# Patient Record
Sex: Female | Born: 1935 | Race: Black or African American | Hispanic: No | Smoking: Never smoker
Health system: Southern US, Community
[De-identification: ages and names within clinical notes are randomized; demographics above are authoritative.]

## PROBLEM LIST (undated history)

## (undated) DIAGNOSIS — E785 Hyperlipidemia, unspecified: Secondary | ICD-10-CM

## (undated) DIAGNOSIS — K219 Gastro-esophageal reflux disease without esophagitis: Secondary | ICD-10-CM

## (undated) DIAGNOSIS — Z8719 Personal history of other diseases of the digestive system: Secondary | ICD-10-CM

## (undated) DIAGNOSIS — E119 Type 2 diabetes mellitus without complications: Secondary | ICD-10-CM

## (undated) DIAGNOSIS — F411 Generalized anxiety disorder: Secondary | ICD-10-CM

## (undated) DIAGNOSIS — M129 Arthropathy, unspecified: Secondary | ICD-10-CM

## (undated) DIAGNOSIS — I10 Essential (primary) hypertension: Secondary | ICD-10-CM

## (undated) DIAGNOSIS — D649 Anemia, unspecified: Secondary | ICD-10-CM

## (undated) DIAGNOSIS — E538 Deficiency of other specified B group vitamins: Secondary | ICD-10-CM

## (undated) DIAGNOSIS — E042 Nontoxic multinodular goiter: Secondary | ICD-10-CM

## (undated) HISTORY — DX: Hyperlipidemia, unspecified: E78.5

## (undated) HISTORY — DX: Type 2 diabetes mellitus without complications: E11.9

## (undated) HISTORY — DX: Personal history of other diseases of the digestive system: Z87.19

## (undated) HISTORY — DX: Arthropathy, unspecified: M12.9

## (undated) HISTORY — DX: Essential (primary) hypertension: I10

## (undated) HISTORY — DX: Gastro-esophageal reflux disease without esophagitis: K21.9

## (undated) HISTORY — DX: Anemia, unspecified: D64.9

## (undated) HISTORY — DX: Generalized anxiety disorder: F41.1

## (undated) HISTORY — DX: Nontoxic multinodular goiter: E04.2

## (undated) HISTORY — PX: ABDOMINAL HYSTERECTOMY: SHX81

## (undated) HISTORY — DX: Deficiency of other specified B group vitamins: E53.8

---

## 1998-02-21 ENCOUNTER — Ambulatory Visit (HOSPITAL_COMMUNITY): Admission: RE | Admit: 1998-02-21 | Discharge: 1998-02-21 | Payer: Self-pay | Admitting: Internal Medicine

## 1998-02-26 ENCOUNTER — Emergency Department (HOSPITAL_COMMUNITY): Admission: EM | Admit: 1998-02-26 | Discharge: 1998-02-26 | Payer: Self-pay | Admitting: Emergency Medicine

## 1998-03-01 ENCOUNTER — Other Ambulatory Visit: Admission: RE | Admit: 1998-03-01 | Discharge: 1998-03-01 | Payer: Self-pay | Admitting: Internal Medicine

## 1998-03-06 ENCOUNTER — Ambulatory Visit: Admission: RE | Admit: 1998-03-06 | Discharge: 1998-03-06 | Payer: Self-pay | Admitting: Internal Medicine

## 1998-04-22 ENCOUNTER — Ambulatory Visit (HOSPITAL_COMMUNITY): Admission: RE | Admit: 1998-04-22 | Discharge: 1998-04-22 | Payer: Self-pay | Admitting: Internal Medicine

## 1998-04-22 ENCOUNTER — Other Ambulatory Visit: Admission: RE | Admit: 1998-04-22 | Discharge: 1998-04-22 | Payer: Self-pay | Admitting: Internal Medicine

## 1998-06-02 ENCOUNTER — Ambulatory Visit (HOSPITAL_COMMUNITY): Admission: RE | Admit: 1998-06-02 | Discharge: 1998-06-02 | Payer: Self-pay | Admitting: *Deleted

## 1998-07-14 ENCOUNTER — Emergency Department (HOSPITAL_COMMUNITY): Admission: EM | Admit: 1998-07-14 | Discharge: 1998-07-14 | Payer: Self-pay | Admitting: Emergency Medicine

## 1998-08-02 ENCOUNTER — Encounter: Payer: Self-pay | Admitting: Internal Medicine

## 1998-08-02 ENCOUNTER — Ambulatory Visit (HOSPITAL_COMMUNITY): Admission: RE | Admit: 1998-08-02 | Discharge: 1998-08-02 | Payer: Self-pay | Admitting: Internal Medicine

## 1998-08-18 ENCOUNTER — Ambulatory Visit (HOSPITAL_COMMUNITY): Admission: RE | Admit: 1998-08-18 | Discharge: 1998-08-18 | Payer: Self-pay | Admitting: *Deleted

## 1998-09-15 ENCOUNTER — Ambulatory Visit (HOSPITAL_COMMUNITY): Admission: RE | Admit: 1998-09-15 | Discharge: 1998-09-15 | Payer: Self-pay | Admitting: *Deleted

## 1998-09-15 ENCOUNTER — Encounter: Payer: Self-pay | Admitting: *Deleted

## 1999-06-11 ENCOUNTER — Encounter: Payer: Self-pay | Admitting: *Deleted

## 1999-06-11 ENCOUNTER — Ambulatory Visit (HOSPITAL_COMMUNITY): Admission: RE | Admit: 1999-06-11 | Discharge: 1999-06-11 | Payer: Self-pay | Admitting: *Deleted

## 1999-08-23 ENCOUNTER — Ambulatory Visit (HOSPITAL_COMMUNITY): Admission: RE | Admit: 1999-08-23 | Discharge: 1999-08-23 | Payer: Self-pay | Admitting: Cardiovascular Disease

## 1999-08-23 ENCOUNTER — Encounter: Payer: Self-pay | Admitting: Cardiovascular Disease

## 1999-10-06 ENCOUNTER — Encounter: Payer: Self-pay | Admitting: Emergency Medicine

## 1999-10-06 ENCOUNTER — Emergency Department (HOSPITAL_COMMUNITY): Admission: EM | Admit: 1999-10-06 | Discharge: 1999-10-06 | Payer: Self-pay | Admitting: Emergency Medicine

## 1999-11-10 ENCOUNTER — Emergency Department (HOSPITAL_COMMUNITY): Admission: EM | Admit: 1999-11-10 | Discharge: 1999-11-10 | Payer: Self-pay | Admitting: Emergency Medicine

## 1999-11-10 ENCOUNTER — Encounter: Payer: Self-pay | Admitting: Internal Medicine

## 2000-01-04 ENCOUNTER — Encounter: Payer: Self-pay | Admitting: Internal Medicine

## 2000-01-04 ENCOUNTER — Encounter: Admission: RE | Admit: 2000-01-04 | Discharge: 2000-01-04 | Payer: Self-pay | Admitting: Internal Medicine

## 2000-11-24 ENCOUNTER — Encounter: Payer: Self-pay | Admitting: Internal Medicine

## 2000-11-24 ENCOUNTER — Encounter: Admission: RE | Admit: 2000-11-24 | Discharge: 2000-11-24 | Payer: Self-pay | Admitting: Internal Medicine

## 2003-07-25 ENCOUNTER — Other Ambulatory Visit: Admission: RE | Admit: 2003-07-25 | Discharge: 2003-07-25 | Payer: Self-pay | Admitting: Internal Medicine

## 2003-08-28 ENCOUNTER — Encounter: Payer: Self-pay | Admitting: Emergency Medicine

## 2003-08-28 ENCOUNTER — Emergency Department (HOSPITAL_COMMUNITY): Admission: EM | Admit: 2003-08-28 | Discharge: 2003-08-28 | Payer: Self-pay | Admitting: Emergency Medicine

## 2003-11-12 LAB — HM COLONOSCOPY: HM Colonoscopy: NORMAL

## 2003-11-22 ENCOUNTER — Ambulatory Visit (HOSPITAL_COMMUNITY): Admission: RE | Admit: 2003-11-22 | Discharge: 2003-11-22 | Payer: Self-pay | Admitting: Cardiology

## 2003-12-13 DIAGNOSIS — Z8719 Personal history of other diseases of the digestive system: Secondary | ICD-10-CM

## 2003-12-13 HISTORY — DX: Personal history of other diseases of the digestive system: Z87.19

## 2004-01-04 ENCOUNTER — Encounter (INDEPENDENT_AMBULATORY_CARE_PROVIDER_SITE_OTHER): Payer: Self-pay | Admitting: Specialist

## 2004-01-04 ENCOUNTER — Ambulatory Visit (HOSPITAL_COMMUNITY): Admission: RE | Admit: 2004-01-04 | Discharge: 2004-01-04 | Payer: Self-pay | Admitting: Gastroenterology

## 2005-07-08 ENCOUNTER — Encounter: Payer: Self-pay | Admitting: Internal Medicine

## 2005-10-16 ENCOUNTER — Encounter: Admission: RE | Admit: 2005-10-16 | Discharge: 2005-10-16 | Payer: Self-pay | Admitting: Internal Medicine

## 2005-12-17 ENCOUNTER — Encounter: Admission: RE | Admit: 2005-12-17 | Discharge: 2006-03-17 | Payer: Self-pay | Admitting: Internal Medicine

## 2006-02-10 ENCOUNTER — Inpatient Hospital Stay (HOSPITAL_COMMUNITY): Admission: AD | Admit: 2006-02-10 | Discharge: 2006-02-13 | Payer: Self-pay | Admitting: Internal Medicine

## 2006-09-12 ENCOUNTER — Encounter: Payer: Self-pay | Admitting: Internal Medicine

## 2006-10-30 ENCOUNTER — Encounter: Admission: RE | Admit: 2006-10-30 | Discharge: 2006-10-30 | Payer: Self-pay | Admitting: Internal Medicine

## 2006-11-13 ENCOUNTER — Encounter: Admission: RE | Admit: 2006-11-13 | Discharge: 2006-11-13 | Payer: Self-pay | Admitting: Internal Medicine

## 2007-07-22 ENCOUNTER — Encounter: Payer: Self-pay | Admitting: Internal Medicine

## 2007-07-22 LAB — CONVERTED CEMR LAB
Cholesterol: 220 mg/dL
LDL Cholesterol: 122 mg/dL

## 2007-11-17 ENCOUNTER — Encounter: Admission: RE | Admit: 2007-11-17 | Discharge: 2007-11-17 | Payer: Self-pay | Admitting: Internal Medicine

## 2008-04-07 ENCOUNTER — Encounter: Admission: RE | Admit: 2008-04-07 | Discharge: 2008-04-07 | Payer: Self-pay | Admitting: Internal Medicine

## 2008-11-17 ENCOUNTER — Encounter: Admission: RE | Admit: 2008-11-17 | Discharge: 2008-11-17 | Payer: Self-pay | Admitting: Internal Medicine

## 2009-02-27 ENCOUNTER — Encounter: Payer: Self-pay | Admitting: Internal Medicine

## 2009-02-27 LAB — CONVERTED CEMR LAB
Cholesterol: 190 mg/dL
HDL: 64 mg/dL
Triglyceride fasting, serum: 169 mg/dL

## 2009-11-20 ENCOUNTER — Encounter: Admission: RE | Admit: 2009-11-20 | Discharge: 2009-11-20 | Payer: Self-pay | Admitting: Internal Medicine

## 2009-12-18 ENCOUNTER — Encounter: Admission: RE | Admit: 2009-12-18 | Discharge: 2009-12-18 | Payer: Self-pay | Admitting: Internal Medicine

## 2010-01-25 ENCOUNTER — Encounter: Payer: Self-pay | Admitting: Internal Medicine

## 2010-01-25 LAB — CONVERTED CEMR LAB
ALT: 11 units/L
Albumin: 3.9 g/dL
Alkaline Phosphatase: 101 units/L
CO2: 25 meq/L
Cholesterol: 234 mg/dL
Creatinine, Ser: 0.98 mg/dL
HDL: 64 mg/dL
LDL Cholesterol: 139 mg/dL
Potassium: 5 meq/L
Triglyceride fasting, serum: 157 mg/dL

## 2010-03-28 ENCOUNTER — Encounter: Payer: Self-pay | Admitting: Internal Medicine

## 2010-03-29 ENCOUNTER — Encounter: Payer: Self-pay | Admitting: Internal Medicine

## 2010-03-29 LAB — CONVERTED CEMR LAB
CO2: 24 meq/L
Calcium: 9.4 mg/dL
Chloride: 24 meq/L
HCT: 35.4 %
Hemoglobin: 11.4 g/dL
MCV: 87.2 fL
Platelets: 264 10*3/uL
Potassium: 5.3 meq/L
WBC: 6.6 10*3/uL

## 2010-04-03 ENCOUNTER — Inpatient Hospital Stay (HOSPITAL_BASED_OUTPATIENT_CLINIC_OR_DEPARTMENT_OTHER): Admission: RE | Admit: 2010-04-03 | Discharge: 2010-04-03 | Payer: Self-pay | Admitting: Cardiology

## 2010-04-03 ENCOUNTER — Encounter: Payer: Self-pay | Admitting: Internal Medicine

## 2010-08-03 ENCOUNTER — Encounter: Payer: Self-pay | Admitting: Internal Medicine

## 2010-08-03 LAB — CONVERTED CEMR LAB
ALT: 15 units/L
AST: 15 units/L
Albumin: 4.1 g/dL
Alkaline Phosphatase: 112 units/L
CO2: 24 meq/L
Glucose, Bld: 157 mg/dL
HDL: 71 mg/dL
Total Bilirubin: 0.6 mg/dL
Total Protein: 7.1 g/dL

## 2010-10-08 ENCOUNTER — Ambulatory Visit: Payer: Self-pay | Admitting: Internal Medicine

## 2010-10-08 DIAGNOSIS — R519 Headache, unspecified: Secondary | ICD-10-CM | POA: Insufficient documentation

## 2010-10-08 DIAGNOSIS — R51 Headache: Secondary | ICD-10-CM

## 2010-10-08 DIAGNOSIS — K219 Gastro-esophageal reflux disease without esophagitis: Secondary | ICD-10-CM | POA: Insufficient documentation

## 2010-10-08 DIAGNOSIS — E119 Type 2 diabetes mellitus without complications: Secondary | ICD-10-CM

## 2010-10-08 DIAGNOSIS — Z8719 Personal history of other diseases of the digestive system: Secondary | ICD-10-CM

## 2010-10-08 DIAGNOSIS — D649 Anemia, unspecified: Secondary | ICD-10-CM

## 2010-10-08 DIAGNOSIS — I10 Essential (primary) hypertension: Secondary | ICD-10-CM | POA: Insufficient documentation

## 2010-10-08 DIAGNOSIS — R5383 Other fatigue: Secondary | ICD-10-CM | POA: Insufficient documentation

## 2010-10-08 DIAGNOSIS — E785 Hyperlipidemia, unspecified: Secondary | ICD-10-CM

## 2010-10-08 DIAGNOSIS — R5381 Other malaise: Secondary | ICD-10-CM

## 2010-10-08 DIAGNOSIS — F411 Generalized anxiety disorder: Secondary | ICD-10-CM | POA: Insufficient documentation

## 2010-10-08 DIAGNOSIS — R221 Localized swelling, mass and lump, neck: Secondary | ICD-10-CM | POA: Insufficient documentation

## 2010-10-08 DIAGNOSIS — R220 Localized swelling, mass and lump, head: Secondary | ICD-10-CM

## 2010-10-08 DIAGNOSIS — M129 Arthropathy, unspecified: Secondary | ICD-10-CM | POA: Insufficient documentation

## 2010-10-09 DIAGNOSIS — E538 Deficiency of other specified B group vitamins: Secondary | ICD-10-CM

## 2010-10-09 LAB — CONVERTED CEMR LAB
Alkaline Phosphatase: 104 units/L (ref 39–117)
Basophils Relative: 0.3 % (ref 0.0–3.0)
Bilirubin, Direct: 0.1 mg/dL (ref 0.0–0.3)
Cholesterol: 171 mg/dL (ref 0–200)
Creatinine, Ser: 0.8 mg/dL (ref 0.4–1.2)
Folate: 20 ng/mL
HCT: 29.7 % — ABNORMAL LOW (ref 36.0–46.0)
HDL: 57.9 mg/dL (ref >39.00)
Hemoglobin: 10.1 g/dL — ABNORMAL LOW (ref 12.0–15.0)
Hgb A1c MFr Bld: 6.7 % — ABNORMAL HIGH (ref 4.6–6.5)
LDL Cholesterol: 91 mg/dL (ref 0–99)
MCV: 87.9 fL (ref 78.0–100.0)
Monocytes Absolute: 0.6 10*3/uL (ref 0.1–1.0)
Neutro Abs: 6.3 10*3/uL (ref 1.4–7.7)
Neutrophils Relative %: 70.9 % (ref 43.0–77.0)
Platelets: 397 10*3/uL (ref 150.0–400.0)
RDW: 13.1 % (ref 11.5–14.6)
TSH: 1.49 microintl units/mL (ref 0.35–5.50)
Total CHOL/HDL Ratio: 3
Total Protein: 6.9 g/dL (ref 6.0–8.3)
Triglycerides: 110 mg/dL (ref 0.0–149.0)
Vitamin B-12: 174 pg/mL — ABNORMAL LOW (ref 211–911)
WBC: 8.9 10*3/uL (ref 4.5–10.5)

## 2010-10-10 ENCOUNTER — Encounter: Admission: RE | Admit: 2010-10-10 | Discharge: 2010-10-10 | Payer: Self-pay | Admitting: Internal Medicine

## 2010-10-10 DIAGNOSIS — E042 Nontoxic multinodular goiter: Secondary | ICD-10-CM

## 2010-10-16 ENCOUNTER — Ambulatory Visit: Payer: Self-pay | Admitting: Internal Medicine

## 2010-11-21 ENCOUNTER — Ambulatory Visit
Admission: RE | Admit: 2010-11-21 | Discharge: 2010-11-21 | Payer: Self-pay | Source: Home / Self Care | Attending: Internal Medicine | Admitting: Internal Medicine

## 2010-11-21 ENCOUNTER — Encounter
Admission: RE | Admit: 2010-11-21 | Discharge: 2010-11-21 | Payer: Self-pay | Source: Home / Self Care | Attending: Internal Medicine | Admitting: Internal Medicine

## 2010-11-22 ENCOUNTER — Encounter: Payer: Self-pay | Admitting: Internal Medicine

## 2010-11-22 ENCOUNTER — Ambulatory Visit
Admission: RE | Admit: 2010-11-22 | Discharge: 2010-11-22 | Payer: Self-pay | Source: Home / Self Care | Attending: Internal Medicine | Admitting: Internal Medicine

## 2010-11-22 DIAGNOSIS — J019 Acute sinusitis, unspecified: Secondary | ICD-10-CM | POA: Insufficient documentation

## 2010-12-11 NOTE — Assessment & Plan Note (Signed)
Summary: PER SARAH B12 INJ  VL   STC  Nurse Visit   Allergies: No Known Drug Allergies  Medication Administration  Injection # 1:    Medication: Vit B12 1000 mcg    Diagnosis: VITAMIN B12 DEFICIENCY (ICD-266.2)    Route: IM    Site: L deltoid    Exp Date: 06/11/2012    Lot #: [id number]    Mfr: American Regent    Patient tolerated injection without complications    Given by: Lamar Sprinkles, CMA (October 16, 2010 3:18 PM)  Orders Added: 1)  Vit B12 1000 mcg [J3420] 2)  Admin of Therapeutic Inj  intramuscular or subcutaneous [96372]   Medication Administration  Injection # 1:    Medication: Vit B12 1000 mcg    Diagnosis: VITAMIN B12 DEFICIENCY (ICD-266.2)    Route: IM    Site: L deltoid    Exp Date: 06/11/2012    Lot #: [id number]    Mfr: American Regent    Patient tolerated injection without complications    Given by: Lamar Sprinkles, CMA (October 16, 2010 3:18 PM)  Orders Added: 1)  Vit B12 1000 mcg [J3420] 2)  Admin of Therapeutic Inj  intramuscular or subcutaneous [81191]

## 2010-12-11 NOTE — Assessment & Plan Note (Signed)
Summary: New / Medicare / # cd   Vital Signs:  Patient profile:   77 year old female Height:      65.5 inches (166.37 cm) Weight:      193.8 pounds (88.09 kg) BMI:     31.87 O2 Sat:      97 % on Room air Temp:     98.9 degrees F (37.17 degrees C) oral Pulse rate:   83 / minute BP sitting:   152 / 76  (left arm) Cuff size:   large  Vitals Entered By: Orlan Leavens RMA (October 08, 2010 9:37 AM)  O2 Flow:  Room air CC: New patient Is Patient Diabetic? Yes Did you bring your meter with you today? No Pain Assessment Patient in pain? no        Primary Care Provider:  Newt Lukes MD  CC:  New patient.  History of Present Illness: new pt to me and our practice, here to est care declines providing info on prior PCP at this time --  1) DM2 - reports compliance with ongoing medical treatment and no changes in medication dose or frequency. denies adverse side effects related to current therapy. checks sugars 2x/d, no symptoms or signs hypoglycemia  2) HTN - reports compliance with ongoing medical treatment and no changes in medication dose or frequency. denies adverse side effects related to current therapy. no CP, HA or change in  edema  3) dyslipidema - reports compliance with ongoing medical treatment and no changes in medication dose or frequency. denies adverse side effects related to current therapy. no myalgias or abd pain/GI symptoms   4) GERD - reports compliance with ongoing medical treatment and no changes in medication dose or frequency. denies adverse side effects related to current therapy.   Preventive Screening-Counseling & Management  Alcohol-Tobacco     Alcohol drinks/day: 0     Alcohol Counseling: not indicated; patient does not drink     Smoking Status: never     Tobacco Counseling: not indicated; no tobacco use  Caffeine-Diet-Exercise     Does Patient Exercise: yes     Times/week: 3     Exercise Counseling: to improve exercise regimen  Depression Counseling: not indicated; screening negative for depression  Safety-Violence-Falls     Seat Belt Counseling: not indicated; patient wears seat belts     Helmet Counseling: not applicable     Firearm Counseling: not indicated; uses recommended firearm safety measures     Violence Counseling: not applicable     Fall Risk Counseling: not indicated; no significant falls noted  Clinical Review Panels:  Prevention   Last Mammogram:  No specific mammographic evidence of malignancy.  Assessment: BIRADS 1. [facility].    (11/20/2009)   Last Colonoscopy:  Results: Normal. (11/12/2003)   Current Medications (verified): 1)  High Potency Iron 86 (27 Fe) Mg Caps (Ferrous Fumarate) .... Take 1 By Mouth Once Daily 2)  Lisinopril 20 Mg Tabs (Lisinopril) .... Take 1 By Mouth Once Daily 3)  Simvastatin 40 Mg Tabs (Simvastatin) .... Take 1 At Bedtime 4)  Metformin Hcl 1000 Mg Tabs (Metformin Hcl) .... Take 1 Two Times A Day 5)  Aspirin 81 Mg Tabs (Aspirin) .... Take 1 By Mouth Once Daily 6)  Prilosec Otc 20 Mg Tbec (Omeprazole Magnesium) .... 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 I have reviewed all other systems and they were negative.    Physical Exam  General:  alert, well-developed, well-nourished, and cooperative to examination.    Head:  Normocephalic and atraumatic without obvious abnormalities. No apparent alopecia or balding. Eyes:  vision grossly intact; pupils equal, round and reactive to light.  conjunctiva and lids normal.    Ears:  normal pinnae bilaterally, without erythema, swelling, or tenderness to palpation. TMs clear, without effusion, or cerumen impaction. Hearing grossly normal bilaterally  Mouth:  upper plate and teeth and gums in fair repair; mucous membranes moist, without lesions or ulcers. oropharynx clear without exudate, no erythema.  Neck:  supple, full ROM, soft 2cm  right lower anterior neck with well healed scar below this mass (prior surg), no thyromegaly; no thyroid nodules or tenderness. no JVD or carotid bruits.   Lungs:  normal respiratory effort, no intercostal retractions or use of accessory muscles; normal breath sounds bilaterally - no crackles and no wheezes.    Heart:  normal rate, regular rhythm, 3/6 holosyst murmur, and no rub. BLE without edema. normal DP pulses and normal cap refill in all 4 extremities    Abdomen:  soft, non-tender, normal bowel sounds, no distention; no masses and no appreciable hepatomegaly or splenomegaly.   Genitalia:  defer Msk:  No deformity or scoliosis noted of thoracic or lumbar spine.   Neurologic:  alert & oriented X3 and cranial nerves II-XII symetrically intact.  strength normal in all extremities, sensation intact to light touch, and gait normal. speech fluent without dysarthria or aphasia; follows commands with good comprehension.  Skin:  no rashes, vesicles, ulcers, or erythema. No nodules or irregularity to palpation.  Psych:  Oriented X3, memory intact for recent and remote, normally interactive, good eye contact, not anxious appearing, not depressed appearing, and not agitated.      Impression & Recommendations:  Problem # 1:  DIABETES  MELLITUS, TYPE II (ICD-250.00) misunderstanding per pt re: meds - has only taken once daily -  will check a1c and Cr but encourage compliance with two times a day dose metformin prior to other med changes pt declines [place] sedning for PCP records - but ok ROI from cards Her updated medication list for this problem includes:    Lisinopril 20 Mg Tabs (Lisinopril) .Marland Kitchen... Take 1 by mouth once daily    Metformin Hcl 1000 Mg Tabs (Metformin hcl) .Marland Kitchen... Take 1 two times a day    Aspirin 81 Mg Tabs (Aspirin) .Marland Kitchen... Take 1 by mouth once daily  Orders: TLB-A1C / Hgb A1C (Glycohemoglobin) (83036-A1C) TLB-Creatinine, Blood (82565-CREA)  Problem # 2:  HYPERLIPIDEMIA (ICD-272.4)  Her updated medication list for this problem includes:    Simvastatin 40 Mg Tabs (Simvastatin) .Marland Kitchen... Take 1 at bedtime  Orders: TLB-Lipid Panel (80061-LIPID)  Problem # 3:  HYPERTENSION (ICD-401.9)  uncontrolled - add Bbloc to ACEI recehck next visit Her updated medication list for this problem includes:    Lisinopril 20  Mg Tabs (Lisinopril) .Marland Kitchen... Take 1 by mouth once daily    Toprol Xl 50 Mg Xr24h-tab (Metoprolol succinate) .Marland Kitchen... 1 by mouth once daily  Orders: TLB-Creatinine, Blood (82565-CREA) Prescription Created Electronically [phone])  BP today: 152/76  Problem # 4:  NECK MASS (ICD-784.2) hx prior surg removal in 1970s "but it grew back" denies swallow trouble or pain but slow inc size - refer for [place] for eval of same -  Orders: Radiology Referral (Radiology)  Problem # 5:  FATIGUE (ICD-780.79) nonspecific symptoms and no other abn on exam, hx anemia - check labs tx osteoarthritis pain to see if this helps energy - see next Orders: TLB-Creatinine, Blood (82565-CREA) TLB-Hepatic/Liver Function Pnl (80076-HEPATIC) TLB-CBC Platelet - w/Differential (85025-CBCD) TLB-B12 + Folate Pnl (82746_82607-B12/FOL) TLB-Iron, (Fe) Total (83540-FE) TLB-TSH (Thyroid Stimulating Hormone) (84443-TSH)  Problem # 6:   ARTHRITIS, GENERALIZED (ICD-716.99)  pain in knees, right hip (groin) improved with aleve but concerned with hx "ulcer" - 2007 hosp with egd reviewed - gastritis, no ulcer change NSAID to meloxicam for pain symptoms - also rec tylenol consider xray or ortho eval as needed   Orders: Prescription Created Electronically [phone])  Problem # 7:  ANEMIA-NOS (ICD-285.9) remote hx same - on iron for same check labs now - may need to send for records Her updated medication list for this problem includes:    High Potency Iron 86 (27 Fe) Mg Caps (Ferrous fumarate) .Marland Kitchen... Take 1 by mouth once daily  Orders: TLB-CBC Platelet - w/Differential (85025-CBCD) TLB-B12 + Folate Pnl (09811_91478-G95/AOZ) TLB-Iron, (Fe) Total (83540-FE)  Complete Medication List: 1)  High Potency Iron 86 (27 Fe) Mg Caps (Ferrous fumarate) .... Take 1 by mouth once daily 2)  Lisinopril 20 Mg Tabs (Lisinopril) .... Take 1 by mouth once daily 3)  Simvastatin 40 Mg Tabs (Simvastatin) .... Take 1 at bedtime 4)  Metformin Hcl 1000 Mg Tabs (Metformin hcl) .... Take 1 two times a day 5)  Aspirin 81 Mg Tabs (Aspirin) .... Take 1 by mouth once daily 6)  Prilosec Otc 20 Mg Tbec (Omeprazole magnesium) .... Take 1 by mouth once daily 7)  Toprol Xl 50 Mg Xr24h-tab (Metoprolol succinate) .Marland Kitchen.. 1 by mouth once daily 8)  Meloxicam 15 Mg Tabs (Meloxicam) .Marland Kitchen.. 1 by mouth once daily  Patient Instructions: 1)  it was good to see you today. 2)  will send for records from dr. Sharyn Lull to review 3)  test(s) ordered today - your results will be posted on the phone tree for review in 48-72 hours from the time of test completion; call [phone] and enter your 9 digit MRN (listed above on this page, just below your name); if any changes need to be made or there are abnormal results, you will be contacted directly.  4)  we'll make referral for neck ultrasound. Our office will contact you regarding this appointment once made.  5)  start Toprol  (generic ok) for high blood pressure in addition to your other medications 6)  start meloxicam daily for arthritis pain symptoms  7)  your prescriptions and refills have been electronically submitted to your pharmacy. Please take as directed. Contact our office if you believe you're having problems with the medication(s).  8)  Please schedule a follow-up appointment in 3 months to review diabetes, bloos pressure and other issues, call sooner if problems.  Prescriptions: MELOXICAM 15 MG TABS (MELOXICAM) 1 by mouth once daily  #30 x 3   Entered and Authorized by:   Newt Lukes MD  Signed by:   Newt Lukes MD on 10/08/2010   Method used:   Electronically to        [facility] [phone]* (retail)       [address]       [place], [place]  [address]       Ph: [phone]       Fax: [phone]   RxID:   [id number] TOPROL XL 50 MG XR24H-TAB (METOPROLOL SUCCINATE) 1 by mouth once daily  #30 x 3   Entered and Authorized by:   Newt Lukes MD   Signed by:   Newt Lukes MD on 10/08/2010   Method used:   Electronically to        [facility]. [phone]* (retail)       [address]       [place], [place]  [address]       Ph: [phone]       Fax: [phone]   RxID:   [id number]    Orders Added: 1)  TLB-A1C / Hgb A1C (Glycohemoglobin) [83036-A1C] 2)  TLB-Creatinine, Blood [82565-CREA] 3)  TLB-Lipid Panel [80061-LIPID] 4)  TLB-Hepatic/Liver Function Pnl [80076-HEPATIC] 5)  TLB-CBC Platelet - w/Differential [85025-CBCD] 6)  TLB-B12 + Folate Pnl [82746_82607-B12/FOL] 7)  TLB-Iron, (Fe) Total [83540-FE] 8)  TLB-TSH (Thyroid Stimulating Hormone) [29937-JIR] 9)  Radiology Referral [Radiology] 10)  New Patient Level IV [99204] 11)  Prescription Created Electronically [[id number]]     Colonoscopy  Procedure date:  11/12/2003  Findings:      Results: Normal.   EGD  Procedure date:   02/10/2006  Findings:      Findings: Normal , but was positive for gastric polyp in the past, done by Dr. Bosie Clos   Mammogram  Procedure date:  11/20/2009  Findings:      No specific mammographic evidence of malignancy.  Assessment: BIRADS 1. Location: [facility].

## 2010-12-13 NOTE — Cardiovascular Report (Signed)
Summary: [place]  [place]   Imported By: Sherian Rein 12/03/2010 07:40:03  _____________________________________________________________________  External Attachment:    Type:   Image     Comment:   External Document

## 2010-12-13 NOTE — Assessment & Plan Note (Signed)
Summary: HEAD COLD/DRAINAGE/#?CD   Vital Signs:  Patient profile:   77 year old female Height:      65.5 inches (166.37 cm) Weight:      193 pounds (87.73 kg) O2 Sat:      96 % on Room air Temp:     98.3 degrees F (36.83 degrees C) oral Pulse rate:   63 / minute BP sitting:   128 / 70  (left arm) Cuff size:   large  Vitals Entered By: Orlan Leavens RMA (November 22, 2010 10:54 AM)  O2 Flow:  Room air CC: Head cold/ cough Is Patient Diabetic? Yes Did you bring your meter with you today? No Pain Assessment Patient in pain? no        Primary Care Provider:  Newt Lukes MD  CC:  Head cold/ cough.  History of Present Illness: c/o head cold onset 1 month ago started as head congestion, progressied to PND and cough white thick sputum no pleurisy, CP, SOB or fever no HA or sinus pressure but +PND and scratch throat not imrpved with otc meds  reviewed chronic med issues  DM2 - reports compliance with ongoing medical treatment and no changes in medication dose or frequency. denies adverse side effects related to current therapy. checks sugars 2x/d, no symptoms or signs hypoglycemia  HTN - reports compliance with ongoing medical treatment and no changes in medication dose or frequency. denies adverse side effects related to current therapy. no CP, HA or change in  edema  dyslipidema - reports compliance with ongoing medical treatment and no changes in medication dose or frequency. denies adverse side effects related to current therapy. no myalgias or abd pain/GI symptoms   GERD - reports compliance with ongoing medical treatment and no changes in medication dose or frequency. denies adverse side effects related to current therapy.   anemia - long hx iron defic - on iron pills and dx b12 defic 09/2010 - now onging b12 shots - no abd pain, melena or brbpr  Clinical Review Panels:  CBC   WBC:  8.9 (10/08/2010)   RBC:  3.38 (10/08/2010)   Hgb:  10.1 (10/08/2010)   Hct:   29.7 (10/08/2010)   Platelets:  397.0 (10/08/2010)   MCV  87.9 (10/08/2010)   MCHC  34.0 (10/08/2010)   RDW  13.1 (10/08/2010)   PMN:  70.9 (10/08/2010)   Lymphs:  17.2 (10/08/2010)   Monos:  7.3 (10/08/2010)   Eosinophils:  4.3 (10/08/2010)   Basophil:  0.3 (10/08/2010)  Complete Metabolic Panel   Creatinine:  0.8 (10/08/2010)   Albumin:  3.2 (10/08/2010)   Total Protein:  6.9 (10/08/2010)   Total Bili:  0.4 (10/08/2010)   Alk Phos:  104 (10/08/2010)   SGPT (ALT):  11 (10/08/2010)   SGOT (AST):  14 (10/08/2010)   Current Medications (verified): 1)  High Potency Iron 86 (27 Fe) Mg Caps (Ferrous Fumarate) .... Take 1 By Mouth Once Daily 2)  Lisinopril 20 Mg Tabs (Lisinopril) .... Take 1 By Mouth Once Daily 3)  Simvastatin 40 Mg Tabs (Simvastatin) .... Take 1 At Bedtime 4)  Metformin Hcl 1000 Mg Tabs (Metformin Hcl) .... Take 1 Two Times A Day 5)  Aspirin 81 Mg Tabs (Aspirin) .... Take 1 By Mouth Once Daily 6)  Prilosec Otc 20 Mg Tbec (Omeprazole Magnesium) .... Take 1 By Mouth Once Daily 7)  Toprol Xl 50 Mg Xr24h-Tab (Metoprolol Succinate) .Marland Kitchen.. 1 By Mouth Once Daily 8)  Meloxicam 15 Mg Tabs (Meloxicam) .Marland KitchenMarland KitchenMarland Kitchen  1 By Mouth Once Daily 9)  Cyanocobalamin 1000 Mcg/ml Soln (Cyanocobalamin) .Marland Kitchen.. Im Monthly  Allergies (verified): No Known Drug Allergies  Past History:  Past Medical History: Anemia-NOS Anxiety - iron and B12 defic Diabetes mellitus, type II Hyperlipidemia Hypertension GERD  MD roster: card- [facility] - schooler optho - hecker podiatry - traid foot  Social History: Reviewed history from 10/08/2010 and no changes required. 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    Physical Exam  General:  alert, well-developed,  well-nourished, and cooperative to examination.   mildly ill Head:  Normocephalic and atraumatic without obvious abnormalities. No apparent alopecia or balding. Eyes:  vision grossly intact; pupils equal, round and reactive to light.  conjunctiva and lids normal.    Ears:  normal pinnae bilaterally, without erythema, swelling, or tenderness to palpation. TMs clear, without effusion, or cerumen impaction. Hearing grossly normal bilaterally  Mouth:  upper plate and teeth and gums in fair repair; mucous membranes moist, without lesions or ulcers. oropharynx clear without exudate, mod erythema. +PND, thick Lungs:  normal respiratory effort, no intercostal retractions or use of accessory muscles; normal breath sounds bilaterally - no crackles and no wheezes.    Heart:  normal rate, regular rhythm, 3/6 holosyst murmur, and no rub. BLE without edema.   Impression & Recommendations:  Problem # 1:  ACUTE SINUSITIS, UNSPECIFIED (ICD-461.9)  Her updated medication list for this problem includes:    Azithromycin 250 Mg Tabs (Azithromycin) .Marland Kitchen... 2 tabs by mouth today, then 1 by mouth daily starting tomorrow    Fluticasone Propionate 50 Mcg/act Susp (Fluticasone propionate) .Marland Kitchen... 2 sprays each nostril every morning  Instructed on treatment. Call if symptoms persist or worsen.   Orders: Prescription Created Electronically [phone])  Problem # 2:  ANEMIA-NOS (ICD-285.9)  Her updated medication list for this problem includes:    High Potency Iron 86 (27 Fe) Mg Caps (Ferrous fumarate) .Marland Kitchen... Take 1 by mouth once daily    Cyanocobalamin 1000 Mcg/ml Soln (Cyanocobalamin) .Marland KitchenMarland KitchenMarland KitchenMarland Kitchen im monthly  chronic hx same - on iron for same - prior GI eval unremarkable cont iron + b12 supplemt  Hgb: 10.1 (10/08/2010)   Hct: 29.7 (10/08/2010)   Platelets: 397.0 (10/08/2010) RBC: 3.38 (10/08/2010)   RDW: 13.1 (10/08/2010)   WBC: 8.9 (10/08/2010) MCV: 87.9 (10/08/2010)   MCHC: 34.0 (10/08/2010) Iron: 47 (10/08/2010)    B12: 174 (10/08/2010)   Folate: 20.0 (10/08/2010)   TSH: 1.49 (10/08/2010)  Complete Medication List: 1)  High Potency Iron 86 (27 Fe) Mg Caps (Ferrous fumarate) .... Take 1 by mouth once daily 2)  Lisinopril 20 Mg Tabs (Lisinopril) .... Take 1 by mouth once daily 3)  Simvastatin 40 Mg Tabs (Simvastatin) .... Take 1 at bedtime 4)  Metformin Hcl 1000 Mg Tabs (Metformin hcl) .... Take 1 two times a day 5)  Aspirin 81 Mg Tabs (Aspirin) .... Take 1 by mouth once daily 6)  Prilosec Otc 20 Mg Tbec (Omeprazole magnesium) .... Take 1 by mouth once daily 7)  Toprol Xl 50 Mg Xr24h-tab (Metoprolol succinate) .Marland Kitchen.. 1 by mouth once daily 8)  Meloxicam 15 Mg Tabs (Meloxicam) .Marland Kitchen.. 1 by mouth once daily 9)  Cyanocobalamin 1000 Mcg/ml Soln (Cyanocobalamin) .Marland Kitchen.. im monthly 10)  Azithromycin 250 Mg Tabs (Azithromycin) .... 2  tabs by mouth today, then 1 by mouth daily starting tomorrow 11)  Fluticasone Propionate 50 Mcg/act Susp (Fluticasone propionate) .... 2 sprays each nostril every morning  Patient Instructions: 1)  it was good to see you today. 2)  zpak and nose spray for sinus symptoms - your prescriptions have been electronically submitted to your pharmacy. Please take as directed. Contact our office if you believe you're having problems with the medication(s).  3)  Get plenty of rest, drink lots of clear liquids, and use Tylenol or Ibuprofen for fever and comfort, Robitussin for cough. Return in 7-10 days if you're not better:sooner if you're feeling worse 4)  Ok to reschedule a follow-up appointment from [facility] to April or May to review diabetes, blood pressure and other issues, call sooner if problems.  Prescriptions: FLUTICASONE PROPIONATE 50 MCG/ACT SUSP (FLUTICASONE PROPIONATE) 2 sprays each nostril every morning  #1 x 3   Entered and Authorized by:   Newt Lukes MD   Signed by:   Newt Lukes MD on 11/22/2010   Method used:   Electronically to        [facility]  [phone]* (retail)       [address]       [place], [place]  [address]       Ph: [phone]       Fax: [phone]   RxID:   [id number] AZITHROMYCIN 250 MG TABS (AZITHROMYCIN) 2 tabs by mouth today, then 1 by mouth daily starting tomorrow  #6 x 0   Entered and Authorized by:   Newt Lukes MD   Signed by:   Newt Lukes MD on 11/22/2010   Method used:   Electronically to        [facility] [phone]* (retail)       [address]       [place], [place]  [address]       Ph: [phone]       Fax: [phone]   RxID:   [id number]    Orders Added: 1)  Est. Patient Level IV [25427] 2)  Prescription Created Electronically [phone]

## 2010-12-13 NOTE — Assessment & Plan Note (Signed)
Summary: [name]  Nurse Visit   Allergies: No Known Drug Allergies  Medication Administration  Injection # 1:    Medication: Vit B12 1000 mcg    Diagnosis: VITAMIN B12 DEFICIENCY (ICD-266.2)    Route: IM    Site: R deltoid    Exp Date: 06/11/2012    Lot #: [id number]    Mfr: American Regent    Patient tolerated injection without complications    Given by: Margaret Pyle, CMA (November 21, 2010 3:02 PM)  Orders Added: 1)  Admin of Therapeutic Inj  intramuscular or subcutaneous [96372] 2)  Vit B12 1000 mcg [J3420]

## 2010-12-24 ENCOUNTER — Ambulatory Visit (INDEPENDENT_AMBULATORY_CARE_PROVIDER_SITE_OTHER): Payer: Medicare Other

## 2010-12-24 ENCOUNTER — Encounter: Payer: Self-pay | Admitting: Internal Medicine

## 2010-12-24 DIAGNOSIS — E538 Deficiency of other specified B group vitamins: Secondary | ICD-10-CM

## 2011-01-02 NOTE — Assessment & Plan Note (Signed)
Summary: [name] [name]  Nurse Visit   Allergies: No Known Drug Allergies  Medication Administration  Injection # 1:    Medication: Vit B12 1000 mcg    Diagnosis: VITAMIN B12 DEFICIENCY (ICD-266.2)    Route: IM    Site: L deltoid    Exp Date: 09/2012    Lot #: [id number]    Mfr: American Regent    Patient tolerated injection without complications    Given by: Brenton Grills CMA Duncan Dull) (December 24, 2010 3:56 PM)  Orders Added: 1)  Admin of Therapeutic Inj  intramuscular or subcutaneous [96372] 2)  Vit B12 1000 mcg [J3420]

## 2011-01-23 ENCOUNTER — Ambulatory Visit
Admission: RE | Admit: 2011-01-23 | Discharge: 2011-01-23 | Disposition: A | Discharge status: Home / Self Care | Payer: Medicare Other | Source: Ambulatory Visit | Attending: Internal Medicine | Admitting: Internal Medicine

## 2011-01-23 ENCOUNTER — Encounter: Payer: Self-pay | Admitting: Internal Medicine

## 2011-01-23 ENCOUNTER — Other Ambulatory Visit: Payer: Self-pay | Admitting: Internal Medicine

## 2011-01-23 ENCOUNTER — Ambulatory Visit (INDEPENDENT_AMBULATORY_CARE_PROVIDER_SITE_OTHER): Payer: Medicare Other

## 2011-01-23 DIAGNOSIS — R0989 Other specified symptoms and signs involving the circulatory and respiratory systems: Secondary | ICD-10-CM

## 2011-01-23 DIAGNOSIS — E538 Deficiency of other specified B group vitamins: Secondary | ICD-10-CM

## 2011-01-29 NOTE — Assessment & Plan Note (Signed)
Summary: PER PT 1 MTH B12--VAL---STC  Nurse Visit   Allergies: No Known Drug Allergies  Medication Administration  Injection # 1:    Medication: Vit B12 1000 mcg    Diagnosis: VITAMIN B12 DEFICIENCY (ICD-266.2)    Route: IM    Site: R deltoid    Exp Date: 09/2012    Lot #: [id number]    Mfr: American Regent    Patient tolerated injection without complications    Given by: Brenton Grills CMA Duncan Dull) (January 23, 2011 3:31 PM)  Orders Added: 1)  Vit B12 1000 mcg [J3420] 2)  Admin of Therapeutic Inj  intramuscular or subcutaneous [16109]

## 2011-01-30 ENCOUNTER — Encounter: Payer: Self-pay | Admitting: *Deleted

## 2011-02-20 ENCOUNTER — Ambulatory Visit (INDEPENDENT_AMBULATORY_CARE_PROVIDER_SITE_OTHER): Payer: Medicare Other

## 2011-02-20 DIAGNOSIS — E538 Deficiency of other specified B group vitamins: Secondary | ICD-10-CM

## 2011-02-20 MED ORDER — CYANOCOBALAMIN 1000 MCG/ML IJ SOLN
1000.0000 ug | Freq: Once | INTRAMUSCULAR | Status: AC
  Administered 2011-02-20: 1000 ug via INTRAMUSCULAR

## 2011-02-26 ENCOUNTER — Ambulatory Visit: Payer: Self-pay | Admitting: Internal Medicine

## 2011-02-27 ENCOUNTER — Encounter: Payer: Self-pay | Admitting: Internal Medicine

## 2011-03-05 ENCOUNTER — Encounter: Payer: Self-pay | Admitting: Internal Medicine

## 2011-03-06 ENCOUNTER — Ambulatory Visit (INDEPENDENT_AMBULATORY_CARE_PROVIDER_SITE_OTHER): Payer: Medicare Other | Admitting: Internal Medicine

## 2011-03-06 ENCOUNTER — Encounter: Payer: Self-pay | Admitting: Internal Medicine

## 2011-03-06 ENCOUNTER — Other Ambulatory Visit (INDEPENDENT_AMBULATORY_CARE_PROVIDER_SITE_OTHER): Payer: Medicare Other

## 2011-03-06 DIAGNOSIS — I10 Essential (primary) hypertension: Secondary | ICD-10-CM

## 2011-03-06 DIAGNOSIS — E119 Type 2 diabetes mellitus without complications: Secondary | ICD-10-CM

## 2011-03-06 DIAGNOSIS — E785 Hyperlipidemia, unspecified: Secondary | ICD-10-CM

## 2011-03-06 LAB — HEMOGLOBIN A1C: Hgb A1c MFr Bld: 6.5 % (ref 4.6–6.5)

## 2011-03-06 NOTE — Assessment & Plan Note (Signed)
The current medical regimen is effective;  continue present plan and medications.  BP Readings from Last 3 Encounters:  03/06/11 140/72  11/22/10 128/70  10/08/10 152/76

## 2011-03-06 NOTE — Patient Instructions (Signed)
It was good to see you today. Test(s) ordered today. Your results will be called to you after review (48-72hours after test completion). If any changes need to be made, you will be notified at that time. Medications reviewed, no changes at this time. Please schedule followup in 3-4 months, call sooner if problems.  

## 2011-03-06 NOTE — Progress Notes (Signed)
  Subjective:    Patient ID: Beverly Vargas, female    DOB: 1926-12-29, 77 y.o.   MRN: [id number]  HPI  Here for follow up - reviewed chronic med issues  DM2 - reports compliance with ongoing medical treatment and no changes in medication dose or frequency. denies adverse side effects related to current therapy. checks sugars 2x/d, no symptoms or signs hypoglycemia  HTN - reports compliance with ongoing medical treatment and no changes in medication dose or frequency. denies adverse side effects related to current therapy. no CP, HA or change in  edema  dyslipidema - reports compliance with ongoing medical treatment and no changes in medication dose or frequency. denies adverse side effects related to current therapy. no myalgias or abd pain/GI symptoms   GERD - reports compliance with ongoing medical treatment and no changes in medication dose or frequency. denies adverse side effects related to current therapy.   anemia - long hx iron defic - on iron pills and dx b12 defic 09/2010 - now onging b12 shots - no abd pain, melena or brbpr  Past Medical History  Diagnosis Date  . VITAMIN B12 DEFICIENCY   . ARTHRITIS, GENERALIZED   . SCHATZKI'S RING, HX OF   . ANEMIA-NOS   . ANXIETY   . HYPERTENSION   . HYPERLIPIDEMIA   . GERD   . DIABETES MELLITUS, TYPE II   . Nontoxic multinodular goiter     Review of Systems  Constitutional: Negative for fever.  Respiratory: Negative for cough.   Cardiovascular: Negative for chest pain.  Genitourinary: Negative for dysuria.  Neurological: Negative for headaches.       Objective:   Physical Exam  Constitutional: She is oriented to person, place, and time. She appears well-developed and well-nourished. No distress.  Cardiovascular: Normal rate, regular rhythm and normal heart sounds.   No murmur heard. Pulmonary/Chest: Effort normal and breath sounds normal. No respiratory distress.  Abdominal: Soft. Bowel sounds are normal.  Neurological:  She is alert and oriented to person, place, and time. No cranial nerve deficit.  Psychiatric: She has a normal mood and affect. Her behavior is normal.   Lab Results  Component Value Date   WBC 8.9 10/08/2010   HGB 10.1* 10/08/2010   HCT 29.7* 10/08/2010   PLT 397.0 10/08/2010   CHOL 171 10/08/2010   TRIG 110.0 10/08/2010   HDL 57.90 10/08/2010   ALT 11 10/08/2010   AST 14 10/08/2010   NA 142 08/03/2010   K 4.1 08/03/2010   CL 106 08/03/2010   CREATININE 0.8 10/08/2010   BUN 13 08/03/2010   CO2 24 08/03/2010   TSH 1.49 10/08/2010   HGBA1C 6.7* 10/08/2010        Assessment & Plan:  See problem list. Medications and labs reviewed today.

## 2011-03-06 NOTE — Assessment & Plan Note (Signed)
The current medical regimen is effective;  continue present plan and medications. Check a1c

## 2011-03-06 NOTE — Assessment & Plan Note (Signed)
On statin - The current medical regimen is effective;  continue present plan and medications.  

## 2011-03-07 ENCOUNTER — Telehealth: Payer: Self-pay | Admitting: Internal Medicine

## 2011-03-07 NOTE — Telephone Encounter (Signed)
Please call patient - normal a1c results. No medication changes recommended. Thanks.    

## 2011-03-07 NOTE — Telephone Encounter (Signed)
Pt Notified with lab results

## 2011-03-21 ENCOUNTER — Ambulatory Visit: Payer: Medicare Other

## 2011-03-26 ENCOUNTER — Ambulatory Visit (INDEPENDENT_AMBULATORY_CARE_PROVIDER_SITE_OTHER): Payer: Medicare Other

## 2011-03-26 DIAGNOSIS — E538 Deficiency of other specified B group vitamins: Secondary | ICD-10-CM

## 2011-03-26 MED ORDER — CYANOCOBALAMIN 1000 MCG/ML IJ SOLN
1000.0000 ug | Freq: Once | INTRAMUSCULAR | Status: AC
  Administered 2011-03-26: 1000 ug via INTRAMUSCULAR

## 2011-03-29 NOTE — Op Note (Signed)
NAME:  Beverly Vargas, Beverly Vargas                         ACCOUNT NO.:  [id number]   MEDICAL RECORD NO.:  [id number]                   PATIENT TYPE:  AMB   LOCATION:  ENDO                                 FACILITY:  [facility]   PHYSICIAN:  Graylin Shiver, M.D.                DATE OF BIRTH:  07-08-1927   DATE OF PROCEDURE:  01/04/2004  DATE OF DISCHARGE:                                 OPERATIVE REPORT   PROCEDURE PERFORMED:  Esophagogastroduodenoscopy with biopsy and endoscopic  balloon dilatation of a Schatzki's ring.   ENDOSCOPIST:  Graylin Shiver, M.D.   INDICATIONS FOR PROCEDURE:  Intermittent dysphagia, occasional heartburn.  Informed consent was obtained after explanation of the risks of bleeding,  infection and perforation.   PREMEDICATIONS:  Fentanyl 40 mcg IV, Versed 5 mg IV.   DESCRIPTION OF PROCEDURE:  With the patient in the left lateral decubitus  position the Olympus gastroscope was inserted into the oropharynx and passed  into the esophagus.  It was advanced down the esophagus and into the stomach  and into the duodenum.  The second portion and bulb of the duodenum looked  normal.  The stomach revealed a 4 mm polyp in the body of the stomach  towards the posterior wall.  This was biopsied off with cold forceps.  No  other abnormalities were noted on the gastric mucosa including the upper  fundus and cardia seen on retroflexion.  The scope was straightened and  brought back.  There was a hiatal hernia present.  The esophagus revealed a  Schatzki's ring.  This did appear to be mildly stenotic and I suspect this  is where the patient experiences her problem of intermittent dysphagia.  An  endoscopic balloon dilator was advanced down the scope and appropriately  placed at the level of the stricture of the Schatzki's ring.  It was  inflated to 15 mm and held in place for one minute.  It was then deflated.  There was little heme on the dilator from the dilation.  The rest of the  esophagus looked normal.  The patient tolerated the procedure well without  complications.   IMPRESSION:  1. Hiatal hernia.  2. Schatzki's ring dilated to 15 mm.  3. Small 4 mm gastric polyp biopsied off.   PLAN:  We will observe the response to the dilatation.  Hopefully, this will  help with the patient's complaint of intermittent dysphagia.  The biopsy of  the gastric polyp will be checked.                                               Graylin Shiver, M.D.    Beverly Vargas  D:  01/04/2004  T:  01/04/2004  Job:  [id number]   cc:   Minerva Areola L. August Saucer,  M.D.  [address] [address]  [place]  [place] [address]  Fax: [phone]

## 2011-03-29 NOTE — Op Note (Signed)
NAME:  Beverly Vargas, Beverly Vargas                         ACCOUNT NO.:  [id number]   MEDICAL RECORD NO.:  [id number]                   PATIENT TYPE:  AMB   LOCATION:  ENDO                                 FACILITY:  [facility]   PHYSICIAN:  Graylin Shiver, M.D.                DATE OF BIRTH:  [birth year]   DATE OF PROCEDURE:  01/04/2004  DATE OF DISCHARGE:                                 OPERATIVE REPORT   PROCEDURE PERFORMED:  Colonoscopy.   INDICATIONS FOR PROCEDURE:  Screening.   Informed consent was obtained after explanation of the risks of bleeding,  infection, and perforation.   PREMEDICATIONS:  The procedure was done immediately after an EGD with an  additional 40 mcg of fentanyl and 2 mg of Versed given IV.   DESCRIPTION OF PROCEDURE:  With the patient in the left lateral decubitus  position, a rectal exam was performed and no masses were felt.  The Olympus  colonoscope was inserted into the rectum and advanced around the colon to  the cecum.  Cecal landmarks were identified.  The cecum and ascending colon  were normal.  The transverse colon normal.  The descending colon, sigmoid  and rectum were normal.  The patient tolerated the procedure well without  complications.   IMPRESSION:  Normal colonoscopy to the cecum.                                               Graylin Shiver, M.D.    [name]  D:  01/04/2004  T:  01/04/2004  Job:  [id number]   cc:   Minerva Areola L. August Saucer, M.D.  [address] [address]  [place]  [place] [address]  Fax: [phone]

## 2011-03-29 NOTE — Discharge Summary (Signed)
NAME:  Beverly Vargas, Beverly Vargas               ACCOUNT NO.:  [id number]   MEDICAL RECORD NO.:  [id number]          PATIENT TYPE:  INP   LOCATION:  4715                         FACILITY:  [facility]   PHYSICIAN:  Eric L. August Saucer, M.D.     DATE OF BIRTH:  03-Dec-1926   DATE OF ADMISSION:  02/10/2006  DATE OF DISCHARGE:  02/13/2006                                 DISCHARGE SUMMARY   FINAL DIAGNOSES:  1.  Gastritis, gastroduodenitis without hemorrhage, 535.50.  2.  Occult blood in stool, 578.1.  3.  Chronic blood loss anemia, 280.0.  4.  Abdominal pain, 789.09.  5.  Diabetes mellitus, type 2, non-insulin-dependent, 250.00.  6.  Hypertension, 41.9.  7.  Constipation, unspecified, 464.00.  8.  Diaphragmatic hernia, 553.3.  9.  Hyperlipidemia, 272.4.  10. Anxiety disorder.   OPERATIONS/PROCEDURES:  EGD with closed biopsy, 45.16 per Dr. Bosie Clos.   HISTORY OF PRESENT ILLNESS:  This is the first recent [facility]  admission for this 77 year old, divorced, black female with longstanding  history of hypertension, diabetes mellitus, and hyperlipidemia who was seen  on the day of admission complaining of a 5-day history of increasing  abdominal pain, decreased appetite with progressive weakness.  Two nights  prior to admission, she noted melanotic stools.  One day prior to admission,  she experienced nausea and vomiting with black stools as well.  On the day  of admission the patient felt weaker with ambulation.  The patient came to  the office for evaluation.  She was noted to have positive stool for blood  on exam.  She was subsequently admitted.  The patient denies any recent  alcohol use.  No tobacco use.  She has not used any recent NSAIDs.  She  acknowledges being under increased stress related to her job activities over  the past few weeks.   PAST MEDICAL HISTORY:  1.  Notable for a colonoscopy in 2005, which was negative.  2.  She had an EGD done as well, which was positive for a gastric  polyp in      the past.  3.  Positive Schatzki ring.  4.  Positive hiatal hernia with reflux.   DISTANT SURGERIES:  Were as noted.  She is status post hysterectomy.   Past medical history and physical exam are per admission H&P.   HOSPITAL COURSE:  The patient was admitted for further evaluation of  abdominal pain with progressive weakness and anemia.  Hemoglobin notably was  10.1 on admission.  She had guaiac positive stools as noted.  She was  started on IV fluids.  H&H were obtained q.12 h. as well.  She was placed on  IV Protonix.  She was seen in consultation by Dr. Bosie Clos of  gastroenterology.  There was concern for possible acute gastroenteritis  bleeding based on her history.  The patient's subsequently, on February 11, 2006, underwent an EGD.  This did not demonstrate active bleeding.  She was,  however, noted to have some mild gastritis.  Biopsies were obtained.  There  was also evidence for a medium sized hiatal  hernia.   The patient was gradually advanced to a soft diet thereafter.  She had no  further loss of blood.  Hemoglobin was monitored over the subsequent day.  It did drop as low as 9.3 but began to rise spontaneously.  No overt  bleeding was noted, however.   As the patient's abdominal symptoms improved, she was gradually made more  ambulatory.  Notably, she had been hypertensive in the past, with the recent  event however, her blood pressure remained normal on no antihypertensive  medications.  She was placed on ferrous gluconate supplementation.  Her CLO  biopsy returned negative for H. pylori.  It was recommended that she have  close outpatient followup thereafter.  The patient was ambulated which she  tolerated well.  She continued to have normal blood pressure.  By February 13, 2006, she was felt to be stable for discharge.   MEDICATIONS AT THE TIME OF DISCHARGE:  1.  Nexium 40 mg p.o. every day.  2.  Fergon 324 mg every day.  3.  Avandomet 2/1,000 mg  b.i.d.   She will need a repeat CBC in one week's time.  Followup in the office with  monitoring of her blood pressure.  As her blood pressure rises, as she  returns back to her premorbid state, she will be re-introduced to her blood  pressure medication gradually.   She will be maintained on a 4-gram sodium, soft diet with no concentrated  sweets.           ______________________________  Lind Guest August Saucer, M.D.     ELD/MEDQ  D:  04/23/2006  T:  04/23/2006  Job:  [id number]

## 2011-03-29 NOTE — Consult Note (Signed)
NAME:  [name], [name] NO.:  [id number]   MEDICAL RECORD NO.:  [id number]          PATIENT TYPE:  INP   LOCATION:  4715                         FACILITY:  [facility]   PHYSICIAN:  Shirley Friar, MDDATE OF BIRTH:  12-Jun-1927   DATE OF CONSULTATION:  02/11/2006  DATE OF DISCHARGE:                                   CONSULTATION   REFERRING PHYSICIAN:  Eric L. August Saucer, M.D.   REASON FOR CONSULTATION:  Melena, abdominal pain.   HISTORY OF PRESENT ILLNESS:  This consult was completed at the request of  Dr. Willey Blade in regards to the patient's recent abdominal pain and melena.  Patient is a pleasant 77 year old black female who was in her usual state of  health until three days ago when she started passing dark tarry stools  without any other problems.  She said this happened several times on February 07, 2006, and again on February 08, 2006.  On February 09, 2006, she began having  vomiting which initially was food colored and then it changed to coffee-  ground emesis.  After multiple episodes of vomiting she had generalized  abdominal pain that was described as a intermittent ache and she also began  having dizziness and nausea.  She called Dr. Diamantina Vargas office yesterday saying  she felt sick, went in to see him in his office and based on her history, he  admitted her for further evaluation.   On presentation, she was afebrile, pulses 90s, blood pressure was 111/59.  Her hemoglobin on presentation was 10.1 and has trended down to 9.3.  She  has not had any bowel movements since she was admitted to the hospital and  says that the abdominal pain has decreased.  She denies any NSAID use except  for two Alka-Seltzers this week.  She denies any dysphagia.  She does have a  past history of melena as well as a history of dysphagia in the past back in  2005.  She underwent an upper endoscopy at that time and was found to have a  Schatzki's ring, hiatal hernia and a benign gastric polyp.   She had dilation  done in 2005, secondary to the Schatzki's ring.  She also had a colonoscopy  done in 2005, which was normal.  Both procedures were performed by Graylin Shiver, M.D.  She says that she no longer has dysphagia, except on a very  rare basis.   PAST MEDICAL HISTORY:  1.  Diabetes mellitus.  2.  Hypertension.  3.  Degenerative joint disease.  4.  History of hiatal hernia.   PAST SURGICAL HISTORY:  Status post hysterectomy.   MEDICATIONS:  1.  Avandamet.  2.  Zocor.  3.  Diovan.  4.  Amitriptyline.   REVIEW OF SYSTEMS:  As above.   PHYSICAL EXAMINATION:  GENERAL APPEARANCE:  Lethargic but arousable,  oriented, no acute distress.  VITAL SIGNS:  Afebrile, pulse 73, blood pressure 117/62.  HEENT:  Sclerae are nonicteric.Marland Kitchen  CHEST:  Clear to auscultation bilaterally.  CARDIOVASCULAR:  Regular rate and rhythm with 2/6 systolic ejection murmur.  ABDOMEN:  Mild epigastric tenderness, otherwise nontender with guarding in  epigastric region, otherwise no guarding.  Soft, nondistended, positive  bowel sounds.  EXTREMITIES:  No edema.   IMPRESSION:  A 77 year old black female with recent onset of melena and  coffee-ground emesis along with abdominal pain, concerning for peptic ulcer  disease versus hemorrhagic gastritis versus Mallory-Weiss tear.  The Mallory-  Weiss tear would not explain her melena since she did not have any vomiting  until two days after the melena started.  She, however, could have peptic  ulcer disease or hemorrhagic gastritis or esophagitis that could be  explaining her symptoms.   PLAN:  Will continue Protonix IV b.i.d. as you are doing.  I will plan for  upper endoscopy this morning to further assess.  Serial CBCs and transfuse  as needed.  I discussed the risks, benefits, and alternatives with patient  regarding upper endoscopy and she agrees to proceed.      Shirley Friar, MD  Electronically Signed     VCS/MEDQ  D:  02/11/2006   T:  02/12/2006  Job:  [id number]   cc:   Graylin Shiver, M.D.  Fax: [phone]   Lind Guest. August Saucer, M.D.  Fax: [phone]

## 2011-03-29 NOTE — Cardiovascular Report (Signed)
NAME:  Beverly Vargas, Beverly Vargas                         ACCOUNT NO.:  [id number]   MEDICAL RECORD NO.:  [id number]                   PATIENT TYPE:  OIB   LOCATION:  2899                                 FACILITY:  [facility]   PHYSICIAN:  Eduardo Osier. Sharyn Lull, M.D.              DATE OF BIRTH:  [birth year]   DATE OF PROCEDURE:  11/22/2003  DATE OF DISCHARGE:                              CARDIAC CATHETERIZATION   PROCEDURE PERFORMED:  1. Left cardiac catheterization.  2. Selective left and right coronary angiography.  3. Left ventriculography via right groin using Judkins technique.   INDICATIONS FOR PROCEDURE:  Beverly Vargas is a 77 year old black female with  past medical history significant for hypertension, non-insulin-dependent  diabetes mellitus, hypercholesterolemia, history of reflux esophagitis.  Complains of vague retrosternal chest pain localized off and on without any  associated symptoms.  Denies any exertional chest pain, but complains of  exertional dyspnea, also associated with feeling weak and tired off and on  for the last two to three months associated with occasional palpitations.  Denies lightheadedness or syncope.  Denies PND, orthopnea, leg swelling.  States had stress test many years ago and was negative.  States has taken  nitroglycerin in the past for chest discomfort.  The patient had EKG done  recently which showed normal sinus rhythm with poor R-wave progression in V1-  V3 and T-wave inversion in anteroseptal leads.   PAST MEDICAL HISTORY:  As above.   PAST SURGICAL HISTORY:  She had a hysterectomy many years ago.   ALLERGIES:  No known drug allergies.   MEDICATIONS:  1. Zocor.  2. Avandamet.  3. Hydrochlorothiazide.  4. Lotrel.   SOCIAL HISTORY:  She is divorced.  Has two children.  No history of smoking.  Drinks occasionally socially.  Born in [place], raised in  [place].   FAMILY HISTORY:  Mother died of sudden cardiac death at the age of 71.  She  was  diabetic.  Father's death cause not known.   PHYSICAL EXAMINATION:  GENERAL:  She was alert, awake, oriented x3 in no  acute distress.  VITAL SIGNS:  Blood pressure 130/80, pulse 76, regular.  HEENT:  Conjunctiva was pink.  NECK:  Supple.  No JVD.  No bruit.  Good carotid upstrokes.  LUNGS:  Clear to auscultation without rhonchi or rales.  CARDIAC:  S1, S2 was normal.  There was soft systolic murmur at the left  lower sternal border and in aortic area.  There is no S3 gallop, no  diastolic murmur or rub.  ABDOMEN:  Soft.  Bowel sounds were present.  Obese, nontender.  EXTREMITIES:  No clubbing, cyanosis, edema.   IMPRESSION:  Atypical chest pain with exertional dyspnea with abnormal EKG,  probably angina equivalent, hypertension, non-insulin-dependent diabetes  mellitus, hypercholesterolemia, morbid obesity, family history of coronary  artery disease.   PLAN:  Discussed with patient regarding various options of treatment, i.e.,  noninvasive  stress testing versus invasive left catheterization, possible  PTCA/stenting in view of multiple risk factors and abnormal EKG, its risks,  i.e., death, MI, stroke, need for emergency CABG, risk of restenosis, local  vascular complications, etc. and consented for PCI.   PROCEDURE:  After obtaining the informed consent patient was brought to the  catheterization laboratory and was placed on fluoroscopy table.  Right groin  was prepped and draped in usual fashion.  2% Xylocaine was used for local  anesthesia in right groin.  With the help of thin wall needle a 6-French  arterial sheath was placed.  The sheath was aspirated and flushed.  Next, 6-  French left Judkins catheter was advanced over the wire under fluoroscopic  guidance up to the ascending aorta where it was pulled out.  The catheter  was aspirated and connected to the manifold.  Catheter was further advanced  and engaged into left coronary ostium.  Multiple views of the left system  were  taken.  Next, the catheter was disengaged and was pulled out over the  wire and was replaced with a 6-French right Judkins catheter which was  advanced over the wire under fluoroscopic guidance up to the ascending aorta  where it was pulled out.  The catheter was aspirated and connected to the  manifold.  Catheter was further advanced and engaged into right coronary  ostium.  Multiple views of the right system were taken.  Next, the catheter  was disengaged and was pulled out over the wire and was replaced with 6-  French pigtail catheter which was advanced over the wire under fluoroscopic  guidance up to the ascending aorta where it was pulled out.  The catheter  was aspirated and connected to the manifold.  Catheter was further advanced  across the aortic valve into the LV.  LV pressures were recorded.  Next, LV  graphy was done in 30 degree RAO position.  Post angiographic pressures were  recorded from LV and then pullback pressures were recorded from aorta.  There was no gradient across the aortic valve.  Next, the pigtail catheter  was pulled out over the wire.  Sheaths were aspirated and flushed.   FINDINGS:  LV showed good LV systolic function, EF of 60-65%.  Left main was  patent.  LAD has 10-15% mid stenosis and 20-30% distal apical stenosis.  Diagonal 1 has 15-20% proximal stenosis.  Left circumflex is patent  proximally and then tapers down in AV groove after giving off large OM2.  OM1 is very, very small which is patent.  OM2 is large which is patent.  RCA  has 20% mid stenosis.  Arteriotomy was closed with vascular closure device  AngioSeal without complications.  The patient tolerated procedure well.  The  patient was transferred to recovery room in stable condition.                                               Eduardo Osier. Sharyn Lull, M.D.    MNH/MEDQ  D:  11/22/2003  T:  11/22/2003  Job:  [id number]   cc:   Cath Lab  Minerva Areola L. August Saucer, M.D.  [address] [address]  [place]  [place]  [address]  Fax: [phone]

## 2011-03-29 NOTE — Op Note (Signed)
NAME:  Beverly Vargas, Beverly Vargas NO.:  [id number]   MEDICAL RECORD NO.:  [id number]          PATIENT TYPE:  INP   LOCATION:  4715                         FACILITY:  [facility]   PHYSICIAN:  Shirley Friar, MDDATE OF BIRTH:  [birth year]   DATE OF PROCEDURE:  02/11/2006  DATE OF DISCHARGE:                                 OPERATIVE REPORT   PROCEDURE:  Upper endoscopy.   INDICATION:  Melena, abdominal pain.   MEDICATIONS:  Fentanyl 50 mcg IV, Versed 4 mg IV.   FINDINGS:  The endoscope was inserted into the oropharynx and the esophagus  was intubated which was normal in its entirety.  The endoscope was advanced  down into the stomach which revealed two areas of superficial erosions and  erythema consistent with mild antral gastritis.  No active bleeding was  seen.  No ulcers were noted.  A biopsy was taken in the distal antrum for  CLO testing.  Retroflexion was done and revealed normal angularis, cardia  and fundus except for a medium size hiatal hernia.  The endoscope was  straightened and advanced down to the duodenal bulb and upon careful  inspection of this revealed normal duodenal bulb without any ulcers.  The  endoscope was advanced down further to the second portion of the duodenum  which was normal in appearance.  The endoscope was withdrawn and reconfirmed  that there was no active bleeding seen and no clots noted in the stomach.  The endoscope was then withdrawn to confirm the above findings.   ASSESSMENT:  1.  Mild gastritis.  2.  Medium size hiatal hernia.  3.  No bleeding seen.   PLAN:  1.  Followup on CLO test and treat if positive. (Addendum: CLO test result      negative)  2.  Change Protonix to p.o. b.i.d.  3.  Follow serial CBCs and transfuse as needed.  4.  Source of her bleeding at home is unclear, could be from her gastritis      although it was mild and unlikely to produce melena so one question is      was it trace amounts or large amounts. If  trace amounts, then it is      likely      from mild gastritis.  History is not consistent with a Mallory-Weiss      tear.  If anemia persists or melena recurs would recommend doing small      bowel follow-through versus capsule endoscopy.  The patient had      colonoscopy two years ago which was normal and would not repeat that      unless symptoms change.      Shirley Friar, MD  Electronically Signed     VCS/MEDQ  D:  02/11/2006  T:  02/12/2006  Job:  [id number]   cc:   Minerva Areola L. August Saucer, M.D.  Fax: [phone]   Graylin Shiver, M.D.  Fax: [phone]

## 2011-04-18 ENCOUNTER — Ambulatory Visit (INDEPENDENT_AMBULATORY_CARE_PROVIDER_SITE_OTHER): Payer: Medicare Other

## 2011-04-18 DIAGNOSIS — E538 Deficiency of other specified B group vitamins: Secondary | ICD-10-CM

## 2011-04-18 MED ORDER — CYANOCOBALAMIN 1000 MCG/ML IJ SOLN
1000.0000 ug | Freq: Once | INTRAMUSCULAR | Status: AC
  Administered 2011-04-18: 1000 ug via INTRAMUSCULAR

## 2011-04-30 ENCOUNTER — Encounter: Payer: Self-pay | Admitting: Internal Medicine

## 2011-05-02 ENCOUNTER — Other Ambulatory Visit: Payer: Self-pay | Admitting: Internal Medicine

## 2011-05-23 ENCOUNTER — Ambulatory Visit (INDEPENDENT_AMBULATORY_CARE_PROVIDER_SITE_OTHER): Payer: Medicare Other

## 2011-05-23 DIAGNOSIS — E538 Deficiency of other specified B group vitamins: Secondary | ICD-10-CM

## 2011-05-23 MED ORDER — CYANOCOBALAMIN 1000 MCG/ML IJ SOLN
1000.0000 ug | Freq: Once | INTRAMUSCULAR | Status: AC
  Administered 2011-05-23: 1000 ug via INTRAMUSCULAR

## 2011-06-05 ENCOUNTER — Ambulatory Visit: Payer: Medicare Other | Admitting: Internal Medicine

## 2011-06-19 ENCOUNTER — Encounter: Payer: Self-pay | Admitting: Internal Medicine

## 2011-06-19 ENCOUNTER — Other Ambulatory Visit (INDEPENDENT_AMBULATORY_CARE_PROVIDER_SITE_OTHER): Payer: Medicare Other

## 2011-06-19 ENCOUNTER — Ambulatory Visit (INDEPENDENT_AMBULATORY_CARE_PROVIDER_SITE_OTHER): Payer: Medicare Other | Admitting: Internal Medicine

## 2011-06-19 DIAGNOSIS — E119 Type 2 diabetes mellitus without complications: Secondary | ICD-10-CM

## 2011-06-19 DIAGNOSIS — D649 Anemia, unspecified: Secondary | ICD-10-CM

## 2011-06-19 DIAGNOSIS — E538 Deficiency of other specified B group vitamins: Secondary | ICD-10-CM

## 2011-06-19 DIAGNOSIS — Z23 Encounter for immunization: Secondary | ICD-10-CM

## 2011-06-19 LAB — CBC WITH DIFFERENTIAL/PLATELET
Basophils Absolute: 0.2 10*3/uL — ABNORMAL HIGH (ref 0.0–0.1)
Eosinophils Absolute: 0.8 10*3/uL — ABNORMAL HIGH (ref 0.0–0.7)
HCT: 35.3 % — ABNORMAL LOW (ref 36.0–46.0)
Hemoglobin: 11.8 g/dL — ABNORMAL LOW (ref 12.0–15.0)
Lymphs Abs: 2.2 10*3/uL (ref 0.7–4.0)
MCHC: 33.4 g/dL (ref 30.0–36.0)
Neutro Abs: 3.2 10*3/uL (ref 1.4–7.7)
RDW: 13.7 % (ref 11.5–14.6)

## 2011-06-19 LAB — HEMOGLOBIN A1C: Hgb A1c MFr Bld: 6.8 % — ABNORMAL HIGH (ref 4.6–6.5)

## 2011-06-19 MED ORDER — CYANOCOBALAMIN 1000 MCG/ML IJ SOLN
1000.0000 ug | Freq: Once | INTRAMUSCULAR | Status: AC
  Administered 2011-06-19: 1000 ug via INTRAMUSCULAR

## 2011-06-19 NOTE — Patient Instructions (Addendum)
It was good to see you today. Test(s) ordered today. Your results will be called to you after review (48-72hours after test completion). If any changes need to be made, you will be notified at that time. Medications reviewed, no changes at this time. Pneumonia shot and TdAP shot given today (tetnus and whooping cough) Please schedule followup in 3-4 months, call sooner if problems.

## 2011-06-19 NOTE — Assessment & Plan Note (Signed)
Iron defic chronic and B12 defic dx 09/2010 - ?celiac On oral iron and B12 shots -  Prior GI eval reviewed: EGD 4/07 with gastritis, clo neg and colo 2/05 negative Check cbc and iron now

## 2011-06-19 NOTE — Assessment & Plan Note (Signed)
On metformin -The current medical regimen is effective;  continue present plan and medications. Check a1c Lab Results  Component Value Date   HGBA1C 6.5 03/06/2011

## 2011-06-19 NOTE — Progress Notes (Signed)
  Subjective:    Patient ID: Beverly Vargas, female    DOB: 1927-07-07, 77 y.o.   MRN: [id number]  HPI  Here for follow up - reviewed chronic med issues  DM2 - on metformin; reports compliance with ongoing medical treatment and no changes in medication dose or frequency. denies adverse side effects related to current therapy. checks sugars 2x/d, no symptoms or signs hypoglycemia  HTN - reports compliance with ongoing medical treatment and no changes in medication dose or frequency. denies adverse side effects related to current therapy. no chest pain, headache or change in  edema  dyslipidema - on simva; reports compliance with ongoing medical treatment and no changes in medication dose or frequency. denies adverse side effects related to current therapy. no myalgias or abd pain  GERD - reports compliance with ongoing medical treatment and no changes in medication dose or frequency. denies adverse side effects related to current therapy.   Anemia, chronic - long hx iron defic - prior GI eval includes neg colo 2/05 and egd 4/07 with gastritis (clo neg) - on iron pills and dx b12 defic11/2011 - now onging b12 shots - no abd pain, melena or brbpr  Past Medical History  Diagnosis Date  . VITAMIN B12 DEFICIENCY   . ARTHRITIS, GENERALIZED   . SCHATZKI'S RING, HX OF   . ANEMIA-NOS   . ANXIETY   . HYPERTENSION   . HYPERLIPIDEMIA   . GERD   . DIABETES MELLITUS, TYPE II   . Nontoxic multinodular goiter     Review of Systems  Constitutional: Negative for fever.  Respiratory: Negative for cough.   Cardiovascular: Negative for chest pain.  Genitourinary: Negative for dysuria.  Neurological: Negative for headaches.       Objective:   Physical Exam BP 142/72  Pulse 84  Temp(Src) 98.3 F (36.8 C) (Oral)  Ht 5' 5.5" (1.664 m)  Wt 191 lb 12.8 oz (87 kg)  BMI 31.43 kg/m2  SpO2 95%  Constitutional: She is overweight; oriented to person, place, and time. She appears well-developed and  well-nourished. No distress.  Cardiovascular: Normal rate, regular rhythm and normal heart sounds.  No murmur heard. no BLE edema Pulmonary/Chest: Effort normal and breath sounds normal. No respiratory distress.  no wheeze/crackle Neurological: She is alert and oriented to person, place, and time. No cranial nerve deficit.  Psychiatric: She has a normal mood and affect. Her behavior is normal.   Lab Results  Component Value Date   WBC 8.9 10/08/2010   HGB 10.1* 10/08/2010   HCT 29.7* 10/08/2010   PLT 397.0 10/08/2010   CHOL 171 10/08/2010   TRIG 110.0 10/08/2010   HDL 57.90 10/08/2010   ALT 11 10/08/2010   AST 14 10/08/2010   NA 142 08/03/2010   K 4.1 08/03/2010   CL 106 08/03/2010   CREATININE 0.8 10/08/2010   BUN 13 08/03/2010   CO2 24 08/03/2010   TSH 1.49 10/08/2010   HGBA1C 6.5 03/06/2011   Lab Results  Component Value Date   IRON 47 10/08/2010        Assessment & Plan:  See problem list. Medications and labs reviewed today.

## 2011-06-20 ENCOUNTER — Ambulatory Visit: Payer: Medicare Other

## 2011-07-24 ENCOUNTER — Ambulatory Visit (INDEPENDENT_AMBULATORY_CARE_PROVIDER_SITE_OTHER): Payer: Medicare Other | Admitting: *Deleted

## 2011-07-24 DIAGNOSIS — E538 Deficiency of other specified B group vitamins: Secondary | ICD-10-CM

## 2011-07-24 MED ORDER — CYANOCOBALAMIN 1000 MCG/ML IJ SOLN
1000.0000 ug | Freq: Once | INTRAMUSCULAR | Status: AC
  Administered 2011-07-24: 1000 ug via INTRAMUSCULAR

## 2011-08-21 ENCOUNTER — Ambulatory Visit (INDEPENDENT_AMBULATORY_CARE_PROVIDER_SITE_OTHER): Payer: Medicare Other | Admitting: *Deleted

## 2011-08-21 DIAGNOSIS — E538 Deficiency of other specified B group vitamins: Secondary | ICD-10-CM

## 2011-08-21 MED ORDER — CYANOCOBALAMIN 1000 MCG/ML IJ SOLN
1000.0000 ug | Freq: Once | INTRAMUSCULAR | Status: AC
  Administered 2011-08-21: 1000 ug via INTRAMUSCULAR

## 2011-09-18 ENCOUNTER — Ambulatory Visit: Payer: Medicare Other | Admitting: Internal Medicine

## 2011-09-18 DIAGNOSIS — Z0289 Encounter for other administrative examinations: Secondary | ICD-10-CM

## 2011-09-19 ENCOUNTER — Ambulatory Visit (INDEPENDENT_AMBULATORY_CARE_PROVIDER_SITE_OTHER): Payer: Medicare Other | Admitting: *Deleted

## 2011-09-19 DIAGNOSIS — E538 Deficiency of other specified B group vitamins: Secondary | ICD-10-CM

## 2011-09-19 MED ORDER — CYANOCOBALAMIN 1000 MCG/ML IJ SOLN
1000.0000 ug | Freq: Once | INTRAMUSCULAR | Status: AC
  Administered 2011-09-19: 1000 ug via INTRAMUSCULAR

## 2011-10-23 ENCOUNTER — Encounter: Payer: Self-pay | Admitting: Internal Medicine

## 2011-10-23 ENCOUNTER — Other Ambulatory Visit (INDEPENDENT_AMBULATORY_CARE_PROVIDER_SITE_OTHER): Payer: Medicare Other

## 2011-10-23 ENCOUNTER — Ambulatory Visit (INDEPENDENT_AMBULATORY_CARE_PROVIDER_SITE_OTHER): Payer: Medicare Other | Admitting: Internal Medicine

## 2011-10-23 VITALS — BP 144/80 | HR 80 | Temp 98.6°F | Wt 193.0 lb

## 2011-10-23 DIAGNOSIS — D649 Anemia, unspecified: Secondary | ICD-10-CM

## 2011-10-23 DIAGNOSIS — E119 Type 2 diabetes mellitus without complications: Secondary | ICD-10-CM

## 2011-10-23 DIAGNOSIS — D519 Vitamin B12 deficiency anemia, unspecified: Secondary | ICD-10-CM

## 2011-10-23 DIAGNOSIS — D518 Other vitamin B12 deficiency anemias: Secondary | ICD-10-CM

## 2011-10-23 DIAGNOSIS — E785 Hyperlipidemia, unspecified: Secondary | ICD-10-CM

## 2011-10-23 DIAGNOSIS — I10 Essential (primary) hypertension: Secondary | ICD-10-CM

## 2011-10-23 LAB — CBC WITH DIFFERENTIAL/PLATELET
Basophils Absolute: 0.2 K/uL — ABNORMAL HIGH (ref 0.0–0.1)
Basophils Relative: 2.3 % (ref 0.0–3.0)
Eosinophils Absolute: 0.3 K/uL (ref 0.0–0.7)
Eosinophils Relative: 3.6 % (ref 0.0–5.0)
HCT: 35.5 % — ABNORMAL LOW (ref 36.0–46.0)
Hemoglobin: 11.9 g/dL — ABNORMAL LOW (ref 12.0–15.0)
Lymphocytes Relative: 28.2 % (ref 12.0–46.0)
Lymphs Abs: 2.1 K/uL (ref 0.7–4.0)
MCHC: 33.4 g/dL (ref 30.0–36.0)
MCV: 89.4 fl (ref 78.0–100.0)
Monocytes Absolute: 0.6 K/uL (ref 0.1–1.0)
Monocytes Relative: 7.7 % (ref 3.0–12.0)
Neutro Abs: 4.4 K/uL (ref 1.4–7.7)
Neutrophils Relative %: 58.2 % (ref 43.0–77.0)
Platelets: 297 K/uL (ref 150.0–400.0)
RBC: 3.97 Mil/uL (ref 3.87–5.11)
RDW: 13.6 % (ref 11.5–14.6)
WBC: 7.6 K/uL (ref 4.5–10.5)

## 2011-10-23 LAB — HEMOGLOBIN A1C: Hgb A1c MFr Bld: 6.5 % (ref 4.6–6.5)

## 2011-10-23 MED ORDER — CYANOCOBALAMIN 1000 MCG/ML IJ SOLN
1000.0000 ug | Freq: Once | INTRAMUSCULAR | Status: AC
  Administered 2011-10-23: 1000 ug via INTRAMUSCULAR

## 2011-10-23 NOTE — Assessment & Plan Note (Signed)
Iron defic chronic and B12 defic dx 09/2010 - ?celiac On oral iron and B12 shots -  Prior GI eval reviewed: EGD 4/07 with gastritis, clo neg and colo 2/05 negative Check cbc now

## 2011-10-23 NOTE — Progress Notes (Signed)
  Subjective:    Patient ID: Beverly Vargas, female    DOB: December 18, 1926, 78 y.o.   MRN: [id number]  HPI  Here for follow up - reviewed chronic med issues  DM2 - on metformin; reports compliance with ongoing medical treatment and no changes in medication dose or frequency. denies adverse side effects related to current therapy. checks sugars 2x/d, no symptoms or signs hypoglycemia  HTN - reports compliance with ongoing medical treatment and no changes in medication dose or frequency. denies adverse side effects related to current therapy. no chest pain, headache or change in edema  dyslipidema - prev on simva, changed to atorva by cards summer 2012; reports compliance with ongoing medical treatment and no changes in medication dose or frequency. denies adverse side effects related to current therapy. no myalgias or abd pain  GERD - reports compliance with ongoing medical treatment and no changes in medication dose or frequency. denies adverse side effects related to current therapy.   Anemia, chronic - long hx iron defic - prior GI eval includes neg colo 2/05 and egd 4/07 with gastritis (clo neg) - on iron pills and dx b12 defic11/2011 - now onging b12 shots - no abd pain, melena or brbpr  Past Medical History  Diagnosis Date  . VITAMIN B12 DEFICIENCY   . ARTHRITIS, GENERALIZED   . SCHATZKI'S RING, HX OF 12/2003    dilation to 15mm on egd  . ANXIETY   . HYPERTENSION   . HYPERLIPIDEMIA   . GERD   . DIABETES MELLITUS, TYPE II   . Nontoxic multinodular goiter   . ANEMIA-NOS     iron def, B12 defic - egd 4/07: gastritis, clo neg; colo 2/05 neg    Review of Systems  Constitutional: Positive for fatigue. Negative for fever.  Respiratory: Negative for cough.   Cardiovascular: Negative for chest pain, palpitations and leg swelling.       Objective:   Physical Exam  BP 144/80  Pulse 80  Temp(Src) 98.6 F (37 C) (Oral)  Wt 193 lb (87.544 kg)  SpO2 95% Wt Readings from Last 3  Encounters:  10/23/11 193 lb (87.544 kg)  06/19/11 191 lb 12.8 oz (87 kg)  03/06/11 195 lb (88.451 kg)   Constitutional: She is overweight; but appears well-developed and well-nourished. No distress.  Cardiovascular: Normal rate, regular rhythm and normal heart sounds.  No murmur heard. no BLE edema Pulmonary/Chest: Effort normal and breath sounds normal. No respiratory distress.  no wheeze/crackle Neurological: She is alert and oriented to person, place, and time. No cranial nerve deficit.  Psychiatric: She has a normal mood and affect. Her behavior is normal.   Lab Results  Component Value Date   WBC 7.0 06/19/2011   HGB 11.8* 06/19/2011   HCT 35.3* 06/19/2011   PLT 287.0 06/19/2011   CHOL 171 10/08/2010   TRIG 110.0 10/08/2010   HDL 57.90 10/08/2010   ALT 11 10/08/2010   AST 14 10/08/2010   NA 142 08/03/2010   K 4.1 08/03/2010   CL 106 08/03/2010   CREATININE 1.0 06/19/2011   BUN 13 08/03/2010   CO2 24 08/03/2010   TSH 1.49 10/08/2010   HGBA1C 6.8* 06/19/2011   Lab Results  Component Value Date   IRON 74 06/19/2011   TIBC 295 06/19/2011        Assessment & Plan:  See problem list. Medications and labs reviewed today.

## 2011-10-23 NOTE — Assessment & Plan Note (Signed)
On statin, changed from simva to atorva by cards (harwani) The current medical regimen is effective;  continue present plan and medications.

## 2011-10-23 NOTE — Patient Instructions (Signed)
It was good to see you today. Test(s) ordered today. Your results will be called to you after review (48-72hours after test completion). If any changes need to be made, you will be notified at that time. Medications reviewed, no changes at this time. Please schedule followup in 4 months for diabetes mellitus check, call sooner if problems.

## 2011-10-23 NOTE — Assessment & Plan Note (Signed)
On metformin -The current medical regimen is effective;  continue present plan and medications. Check a1c Lab Results  Component Value Date   HGBA1C 6.8* 06/19/2011

## 2011-10-23 NOTE — Assessment & Plan Note (Signed)
The current medical regimen is reasonably effective;  continue present plan and medications.  BP Readings from Last 3 Encounters:  10/23/11 144/80  06/19/11 142/72  03/06/11 140/72

## 2011-10-29 ENCOUNTER — Other Ambulatory Visit: Payer: Self-pay | Admitting: Internal Medicine

## 2011-10-29 DIAGNOSIS — Z1231 Encounter for screening mammogram for malignant neoplasm of breast: Secondary | ICD-10-CM

## 2011-11-21 ENCOUNTER — Ambulatory Visit (INDEPENDENT_AMBULATORY_CARE_PROVIDER_SITE_OTHER): Payer: Medicare Other | Admitting: *Deleted

## 2011-11-21 DIAGNOSIS — E538 Deficiency of other specified B group vitamins: Secondary | ICD-10-CM

## 2011-11-21 MED ORDER — CYANOCOBALAMIN 1000 MCG/ML IJ SOLN
1000.0000 ug | Freq: Once | INTRAMUSCULAR | Status: AC
  Administered 2011-11-21: 1000 ug via INTRAMUSCULAR

## 2011-11-25 ENCOUNTER — Ambulatory Visit
Admission: RE | Admit: 2011-11-25 | Discharge: 2011-11-25 | Disposition: A | Discharge status: Home / Self Care | Payer: Medicare Other | Source: Ambulatory Visit | Attending: Internal Medicine | Admitting: Internal Medicine

## 2011-11-25 DIAGNOSIS — Z1231 Encounter for screening mammogram for malignant neoplasm of breast: Secondary | ICD-10-CM

## 2011-12-08 ENCOUNTER — Emergency Department (INDEPENDENT_AMBULATORY_CARE_PROVIDER_SITE_OTHER)
Admission: EM | Admit: 2011-12-08 | Discharge: 2011-12-08 | Disposition: A | Discharge status: Home / Self Care | Payer: Medicare Other | Source: Home / Self Care | Attending: Emergency Medicine | Admitting: Emergency Medicine

## 2011-12-08 ENCOUNTER — Encounter (HOSPITAL_COMMUNITY): Payer: Self-pay | Admitting: *Deleted

## 2011-12-08 ENCOUNTER — Emergency Department (INDEPENDENT_AMBULATORY_CARE_PROVIDER_SITE_OTHER): Payer: Medicare Other

## 2011-12-08 DIAGNOSIS — S93409A Sprain of unspecified ligament of unspecified ankle, initial encounter: Secondary | ICD-10-CM

## 2011-12-08 NOTE — ED Notes (Signed)
Fell on ice Friday evening, co pain in left lower leg, able to ambulate but is painful

## 2011-12-08 NOTE — ED Provider Notes (Signed)
Chief Complaint  Patient presents with  . Leg Pain    History of Present Illness:  Patient slipped on some ice this past Friday, 3 days ago, at home in her yard, twisting her left ankle. Ever since then she's had pain and swelling over the lateral malleolus. She is able to ambulate. She does have pain with movement of the ankle.  Review of Systems:  Other than noted above, the patient denies any of the following symptoms: Systemic:  No fevers, chills, sweats, or aches.  No fatigue or tiredness. Musculoskeletal:  No joint pain, arthritis, bursitis, swelling, back pain, or neck pain. Neurological:  No muscular weakness, paresthesias, headache, or trouble with speech or coordination.  No dizziness.   PMFSH:  Past medical history, family history, social history, meds, and allergies were reviewed.  Physical Exam:   Vital signs:  BP 195/64  Pulse 102  Temp(Src) 98.3 F (36.8 C) (Oral)  Resp 14  SpO2 96% Gen:  Alert and oriented times 3.  In no distress. Musculoskeletal: There was slight swelling and pain to palpation over the lateral malleolus. The ankle had full range of motion with pain. Anterior drawer sign was negative. Otherwise, all joints had a full a ROM with no swelling, bruising or deformity.  No edema, pulses full. Extremities were warm and pink.  Capillary refill was brisk.  Skin:  Clear, warm and dry.  No rash. Neuro:  Alert and oriented times 3.  Muscle strength was normal.  Sensation was intact to light touch.   Labs:      Radiology:  Dg Ankle Complete Left  12/08/2011  *RADIOLOGY REPORT*  Clinical Data: Soft tissue swelling post fall  LEFT ANKLE COMPLETE - 3+ VIEW  Comparison: None  Findings: Diffuse osseous demineralization. Ankle mortise intact. Diffuse soft tissue swelling. Small plantar calcaneal spur. Question spur at posterior margin of distal tibia. Linear lucency identified at the lateral margin of the distal tibia extending into the ankle joint. This likely is the  sequela of a developmental variant, an epiphyseal cleft. No definite acute fracture, dislocation, or bone destruction.  IMPRESSION: No definite acute bony abnormalities as above.  Original Report Authenticated By: Lollie Marrow, M.D.    Medications given in [facility]:  None  Assessment:   Diagnoses that have been ruled out:  None  Diagnoses that are still under consideration:  None  Final diagnoses:  Ankle sprain    Plan:   1.  The following meds were prescribed:   New Prescriptions   No medications on file   2.  The patient was instructed in symptomatic care, including rest and activity, elevation, application of ice and compression.  Appropriate handouts were given. 3.  The patient was told to return if becoming worse in any way, if no better in 3 or 4 days, and given some red flag symptoms that would indicate earlier return.   4.  The patient was told to follow up in 2 weeks if no improvement.   Roque Lias, MD 12/08/11 2132

## 2011-12-24 ENCOUNTER — Ambulatory Visit (INDEPENDENT_AMBULATORY_CARE_PROVIDER_SITE_OTHER): Payer: Medicare Other | Admitting: *Deleted

## 2011-12-24 DIAGNOSIS — E538 Deficiency of other specified B group vitamins: Secondary | ICD-10-CM

## 2011-12-24 MED ORDER — CYANOCOBALAMIN 1000 MCG/ML IJ SOLN
1000.0000 ug | Freq: Once | INTRAMUSCULAR | Status: AC
  Administered 2011-12-24: 1000 ug via INTRAMUSCULAR

## 2012-01-23 ENCOUNTER — Ambulatory Visit (INDEPENDENT_AMBULATORY_CARE_PROVIDER_SITE_OTHER): Payer: Medicare Other | Admitting: *Deleted

## 2012-01-23 DIAGNOSIS — E538 Deficiency of other specified B group vitamins: Secondary | ICD-10-CM

## 2012-01-23 MED ORDER — CYANOCOBALAMIN 1000 MCG/ML IJ SOLN
1000.0000 ug | Freq: Once | INTRAMUSCULAR | Status: AC
  Administered 2012-01-23: 1000 ug via INTRAMUSCULAR

## 2012-02-06 ENCOUNTER — Other Ambulatory Visit: Payer: Self-pay | Admitting: Internal Medicine

## 2012-02-19 ENCOUNTER — Ambulatory Visit: Payer: Medicare Other | Admitting: Internal Medicine

## 2012-02-26 ENCOUNTER — Ambulatory Visit: Payer: Medicare Other

## 2012-03-05 ENCOUNTER — Ambulatory Visit (INDEPENDENT_AMBULATORY_CARE_PROVIDER_SITE_OTHER): Payer: Medicare Other | Admitting: *Deleted

## 2012-03-05 DIAGNOSIS — E538 Deficiency of other specified B group vitamins: Secondary | ICD-10-CM

## 2012-03-05 MED ORDER — CYANOCOBALAMIN 1000 MCG/ML IJ SOLN
1000.0000 ug | Freq: Once | INTRAMUSCULAR | Status: AC
  Administered 2012-03-05: 1000 ug via INTRAMUSCULAR

## 2012-03-31 ENCOUNTER — Ambulatory Visit (INDEPENDENT_AMBULATORY_CARE_PROVIDER_SITE_OTHER): Payer: Medicare Other | Admitting: *Deleted

## 2012-03-31 DIAGNOSIS — E538 Deficiency of other specified B group vitamins: Secondary | ICD-10-CM

## 2012-03-31 MED ORDER — CYANOCOBALAMIN 1000 MCG/ML IJ SOLN
1000.0000 ug | Freq: Once | INTRAMUSCULAR | Status: AC
  Administered 2012-03-31: 1000 ug via INTRAMUSCULAR

## 2012-04-30 ENCOUNTER — Ambulatory Visit (INDEPENDENT_AMBULATORY_CARE_PROVIDER_SITE_OTHER): Payer: Medicare Other | Admitting: *Deleted

## 2012-04-30 DIAGNOSIS — E538 Deficiency of other specified B group vitamins: Secondary | ICD-10-CM

## 2012-04-30 MED ORDER — CYANOCOBALAMIN 1000 MCG/ML IJ SOLN
1000.0000 ug | Freq: Once | INTRAMUSCULAR | Status: AC
  Administered 2012-04-30: 1000 ug via INTRAMUSCULAR

## 2012-05-29 ENCOUNTER — Ambulatory Visit: Payer: Medicare Other

## 2012-06-04 ENCOUNTER — Ambulatory Visit (INDEPENDENT_AMBULATORY_CARE_PROVIDER_SITE_OTHER): Payer: Medicare Other

## 2012-06-04 DIAGNOSIS — E538 Deficiency of other specified B group vitamins: Secondary | ICD-10-CM

## 2012-06-04 MED ORDER — CYANOCOBALAMIN 1000 MCG/ML IJ SOLN
1000.0000 ug | Freq: Once | INTRAMUSCULAR | Status: AC
  Administered 2012-06-04: 1000 ug via INTRAMUSCULAR

## 2012-06-19 ENCOUNTER — Other Ambulatory Visit (INDEPENDENT_AMBULATORY_CARE_PROVIDER_SITE_OTHER): Payer: Medicare Other

## 2012-06-19 ENCOUNTER — Ambulatory Visit (INDEPENDENT_AMBULATORY_CARE_PROVIDER_SITE_OTHER): Payer: Medicare Other | Admitting: Internal Medicine

## 2012-06-19 ENCOUNTER — Encounter: Payer: Self-pay | Admitting: Internal Medicine

## 2012-06-19 VITALS — BP 132/80 | HR 76 | Temp 97.2°F | Ht 65.5 in | Wt 186.0 lb

## 2012-06-19 DIAGNOSIS — I209 Angina pectoris, unspecified: Secondary | ICD-10-CM

## 2012-06-19 DIAGNOSIS — E785 Hyperlipidemia, unspecified: Secondary | ICD-10-CM

## 2012-06-19 DIAGNOSIS — R5383 Other fatigue: Secondary | ICD-10-CM

## 2012-06-19 DIAGNOSIS — R5381 Other malaise: Secondary | ICD-10-CM

## 2012-06-19 DIAGNOSIS — I10 Essential (primary) hypertension: Secondary | ICD-10-CM

## 2012-06-19 DIAGNOSIS — E119 Type 2 diabetes mellitus without complications: Secondary | ICD-10-CM

## 2012-06-19 DIAGNOSIS — I208 Other forms of angina pectoris: Secondary | ICD-10-CM

## 2012-06-19 LAB — HEPATIC FUNCTION PANEL
ALT: 15 U/L (ref 0–35)
Bilirubin, Direct: 0 mg/dL (ref 0.0–0.3)
Total Protein: 7.6 g/dL (ref 6.0–8.3)

## 2012-06-19 LAB — CBC WITH DIFFERENTIAL/PLATELET
Eosinophils Relative: 5.3 % — ABNORMAL HIGH (ref 0.0–5.0)
HCT: 35.5 % — ABNORMAL LOW (ref 36.0–46.0)
Lymphs Abs: 2.2 10*3/uL (ref 0.7–4.0)
Monocytes Relative: 5.9 % (ref 3.0–12.0)
Platelets: 348 10*3/uL (ref 150.0–400.0)
WBC: 6.9 10*3/uL (ref 4.5–10.5)

## 2012-06-19 LAB — BASIC METABOLIC PANEL
BUN: 18 mg/dL (ref 6–23)
CO2: 26 mEq/L (ref 19–32)
Calcium: 9.3 mg/dL (ref 8.4–10.5)
Chloride: 102 mEq/L (ref 96–112)
Creatinine, Ser: 0.9 mg/dL (ref 0.4–1.2)
GFR: 75.93 mL/min (ref >60.00)
Glucose, Bld: 218 mg/dL — ABNORMAL HIGH (ref 70–99)
Potassium: 4.5 mEq/L (ref 3.5–5.1)
Sodium: 136 mEq/L (ref 135–145)

## 2012-06-19 LAB — LIPID PANEL
Cholesterol: 234 mg/dL — ABNORMAL HIGH (ref 0–200)
HDL: 69.9 mg/dL (ref >39.00)
Triglycerides: 131 mg/dL (ref 0.0–149.0)

## 2012-06-19 LAB — TSH: TSH: 1.06 u[IU]/mL (ref 0.35–5.50)

## 2012-06-19 LAB — HEMOGLOBIN A1C: Hgb A1c MFr Bld: 7.8 % — ABNORMAL HIGH (ref 4.6–6.5)

## 2012-06-19 LAB — LDL CHOLESTEROL, DIRECT: Direct LDL: 150.2 mg/dL

## 2012-06-19 NOTE — Assessment & Plan Note (Signed)
On statin, changed from simva to atorva by cards (harwani) The current medical regimen is effective;  continue present plan and medications.  Check lipids annually  

## 2012-06-19 NOTE — Progress Notes (Signed)
Subjective:    Patient ID: Beverly Vargas, female    DOB: 1927/09/09, 78 y.o.   MRN: [id number]  HPI Here for follow up - reviewed chronic med issues  DM2 - on metformin; reports compliance with ongoing medical treatment and no changes in medication dose or frequency. denies adverse side effects related to current therapy. checks sugars 2x/d, no symptoms or signs hypoglycemia  HTN - reviewed med list discrepancy - also following with with cards and Dr August Saucer. denies adverse side effects related to current therapy. no chest pain, headache or change in edema  dyslipidema - prev on simva, changed to atorva by cards summer 2012; reports compliance with ongoing medical treatment and no changes in medication dose or frequency. denies adverse side effects related to current therapy. no myalgias or abd pain  GERD - reports compliance with ongoing medical treatment and no changes in medication dose or frequency. denies adverse side effects related to current therapy.   Anemia, chronic - long hx iron defic - prior GI eval includes neg colo 2/05 and egd 4/07 with gastritis (clo neg) - on iron pills and dx b12 defic11/2011 - now onging b12 shots - no abdominal pain, melena or brbpr  Past Medical History  Diagnosis Date  . VITAMIN B12 DEFICIENCY   . ARTHRITIS, GENERALIZED   . SCHATZKI'S RING, HX OF 12/2003    dilation to 15mm on egd  . ANXIETY   . HYPERTENSION   . HYPERLIPIDEMIA   . GERD   . DIABETES MELLITUS, TYPE II   . Nontoxic multinodular goiter   . ANEMIA-NOS     iron def, B12 defic - egd 4/07: gastritis, clo neg; colo 2/05 neg    Review of Systems  Constitutional: Positive for fatigue. Negative for fever.  Respiratory: Positive for shortness of breath (DOE x 2 months). Negative for cough.   Cardiovascular: Negative for chest pain, palpitations and leg swelling.  Musculoskeletal: Positive for arthralgias.  Neurological: Positive for headaches.       Objective:   Physical Exam  BP  132/80  Pulse 76  Temp 97.2 F (36.2 C) (Oral)  Ht 5' 5.5" (1.664 m)  Wt 186 lb (84.369 kg)  BMI 30.48 kg/m2  SpO2 96% Wt Readings from Last 3 Encounters:  06/19/12 186 lb (84.369 kg)  10/23/11 193 lb (87.544 kg)  06/19/11 191 lb 12.8 oz (87 kg)    Constitutional: She appears well-developed and well-nourished. No distress.  HENT: Head: Normocephalic and atraumatic. Ears: B TMs ok, no erythema or effusion; Nose: Nose normal. Mouth/Throat: Oropharynx is clear and moist. No oropharyngeal exudate.  Eyes: wears glasses. Conjunctivae and EOM are normal. Pupils are equal, round, and reactive to light. No scleral icterus.  Neck: Normal range of motion. Neck supple. No JVD present. No thyromegaly present.  Cardiovascular: Normal rate, regular rhythm and normal heart sounds.  No murmur heard. No BLE edema. Pulmonary/Chest: Effort normal and breath sounds normal. No respiratory distress. She has no wheezes.  Abdominal: Soft. Bowel sounds are normal. She exhibits no distension. There is no tenderness. no masses Musculoskeletal: senile arthritic changes B hands. Normal range of motion, no joint effusions. No gross deformities Neurological: She is alert and oriented to person, place, and time. No cranial nerve deficit. Coordination normal.  Skin: Skin is warm and dry. No rash noted. No erythema.  Psychiatric: She has an anxious mood and affect. Her behavior is normal. Judgment and thought content normal.    Lab Results  Component Value Date  WBC 7.6 10/23/2011   HGB 11.9* 10/23/2011   HCT 35.5* 10/23/2011   PLT 297.0 10/23/2011   CHOL 171 10/08/2010   TRIG 110.0 10/08/2010   HDL 57.90 10/08/2010   ALT 11 10/08/2010   AST 14 10/08/2010   NA 142 08/03/2010   K 4.1 08/03/2010   CL 106 08/03/2010   CREATININE 1.0 06/19/2011   BUN 13 08/03/2010   CO2 24 08/03/2010   TSH 1.49 10/08/2010   HGBA1C 6.5 10/23/2011   Lab Results  Component Value Date   IRON 74 06/19/2011   TIBC 295 06/19/2011   Ecg:  sinus @ 78 bom - anterior Q - no change from 03/2010     Assessment & Plan:  Fatigue - non specific but associated with sensation of dyspnea on exertion and mild intermittent headache +RUE ache when lying supine - pt concerned with her heart and anemia - check labs and stress test   Atypical angina? - abn ECG as above- refer for nuc stress test   See problem list. Medications and labs reviewed today.

## 2012-06-19 NOTE — Assessment & Plan Note (Addendum)
The current medical regimen is reasonably effective;   Not taking beta-blocker or ACEI so remove from list but encourage ARB compliance given DM.  BP Readings from Last 3 Encounters:  06/19/12 132/80  12/08/11 195/64  10/23/11 144/80

## 2012-06-19 NOTE — Assessment & Plan Note (Signed)
On metformin -The current medical regimen is effective;  continue present plan and medications. Check a1c Lab Results  Component Value Date   HGBA1C 6.5 10/23/2011

## 2012-06-19 NOTE — Patient Instructions (Signed)
It was good to see you today. Medications reviewed, updated today Test(s) ordered today. Your results will be called to you after review (48-72hours after test completion). If any changes need to be made, you will be notified at that time. we'll make referral for cardiac stress test. Our office will contact you regarding appointment(s) once made. Please schedule followup in 3-4 months, call sooner if problems.

## 2012-06-22 MED ORDER — SITAGLIPTIN PHOSPHATE 50 MG PO TABS: 50.0000 mg | ORAL_TABLET | Freq: Every day | ORAL | Status: DC

## 2012-06-22 NOTE — Addendum Note (Signed)
Addended by: Rene Paci A on: 06/22/2012 08:26 AM   Modules accepted: Orders

## 2012-06-29 ENCOUNTER — Other Ambulatory Visit: Payer: Self-pay | Admitting: Internal Medicine

## 2012-07-01 ENCOUNTER — Ambulatory Visit (HOSPITAL_COMMUNITY): Payer: Medicare Other | Attending: Cardiology | Admitting: Radiology

## 2012-07-01 VITALS — BP 155/65 | Ht 65.5 in | Wt 180.0 lb

## 2012-07-01 DIAGNOSIS — R0989 Other specified symptoms and signs involving the circulatory and respiratory systems: Secondary | ICD-10-CM | POA: Insufficient documentation

## 2012-07-01 DIAGNOSIS — E785 Hyperlipidemia, unspecified: Secondary | ICD-10-CM | POA: Insufficient documentation

## 2012-07-01 DIAGNOSIS — I208 Other forms of angina pectoris: Secondary | ICD-10-CM

## 2012-07-01 DIAGNOSIS — R079 Chest pain, unspecified: Secondary | ICD-10-CM | POA: Insufficient documentation

## 2012-07-01 DIAGNOSIS — I10 Essential (primary) hypertension: Secondary | ICD-10-CM

## 2012-07-01 DIAGNOSIS — E119 Type 2 diabetes mellitus without complications: Secondary | ICD-10-CM | POA: Insufficient documentation

## 2012-07-01 DIAGNOSIS — R0609 Other forms of dyspnea: Secondary | ICD-10-CM | POA: Insufficient documentation

## 2012-07-01 DIAGNOSIS — R5383 Other fatigue: Secondary | ICD-10-CM

## 2012-07-01 DIAGNOSIS — R0602 Shortness of breath: Secondary | ICD-10-CM

## 2012-07-01 MED ORDER — TECHNETIUM TC 99M TETROFOSMIN IV KIT
30.0000 | PACK | Freq: Once | INTRAVENOUS | Status: AC | PRN
  Administered 2012-07-01: 30 via INTRAVENOUS

## 2012-07-01 MED ORDER — AMINOPHYLLINE 25 MG/ML IV SOLN
75.0000 mg | Freq: Once | INTRAVENOUS | Status: AC
  Administered 2012-07-01: 75 mg via INTRAVENOUS

## 2012-07-01 MED ORDER — TECHNETIUM TC 99M TETROFOSMIN IV KIT
10.0000 | PACK | Freq: Once | INTRAVENOUS | Status: AC | PRN
  Administered 2012-07-01: 10 via INTRAVENOUS

## 2012-07-01 MED ORDER — REGADENOSON 0.4 MG/5ML IV SOLN
0.4000 mg | Freq: Once | INTRAVENOUS | Status: AC
  Administered 2012-07-01: 0.4 mg via INTRAVENOUS

## 2012-07-01 NOTE — Progress Notes (Signed)
[facility] SITE 3 NUCLEAR MED [address] [place] [address] [phone]  Cardiology Nuclear Med Study  Beverly Vargas is a 78 y.o. female     MRN : [id number]     DOB: 1927-07-27  Procedure Date: 07/01/2012  Nuclear Med Background Indication for Stress Test:  Evaluation for Ischemia History:  Abnormal EKG 2000 MPS (-) EF 73% 03/2010 N/O CAD LAD CFX RCA Cardiac Risk Factors: Hypertension, Lipids and NIDDM  Symptoms:  Chest Pain, DOE and Fatigue   Nuclear Pre-Procedure Caffeine/Decaff Intake:  None NPO After: 10:00pm   Lungs:  clear O2 Sat: 95% on room air. IV 0.9% NS with Angio Cath:  22g  IV Site: R Antecubital  IV Started by:  Bonnita Levan, RN  Chest Size (in):  38 Cup Size: B  Height: 5' 5.5" (1.664 m)  Weight:  180 lb (81.647 kg)  BMI:  Body mass index is 29.50 kg/(m^2). Tech Comments:  Patient held all meds today. This patient was very symptomatic with Lexiscan so she was reversed with Aminophylline 75 mg IV. All symptoms were relieved.     Nuclear Med Study 1 or 2 day study: 1 day  Stress Test Type:  Treadmill/Lexiscan  Reading MD: Marca Ancona, MD  Order Authorizing Provider:  Rene Paci, MD  Resting Radionuclide: Technetium 85m Tetrofosmin  Resting Radionuclide Dose: 11.0 mCi   Stress Radionuclide:  Technetium 47m Tetrofosmin  Stress Radionuclide Dose: 33.0 mCi           Stress Protocol Rest HR: 69 Stress HR: 125  Rest BP: 155/65 Stress BP: 202/57  Exercise Time (min): n/a METS: n/a   Predicted Max HR: 137 bpm % Max HR: 91.24 bpm Rate Pressure Product: 21308   Dose of Adenosine (mg):  n/a Dose of Lexiscan: 0.4 mg  Dose of Atropine (mg): n/a Dose of Dobutamine: n/a mcg/kg/min (at max HR)  Stress Test Technologist: Milana Na, EMT-P  Nuclear Technologist:  Domenic Polite, CNMT     Rest Procedure:  Myocardial perfusion imaging was performed at rest 45 minutes following the intravenous administration of Technetium 24m  Tetrofosmin. Rest ECG: NSR with non-specific ST-T wave changes  Stress Procedure:  The patient received IV Lexiscan 0.4 mg over 15-seconds with concurrent low level exercise and then Technetium 36m Tetrofosmin was injected at 30-seconds while the patient continued walking one more minute. There were no significant changes, sob, headache, and lower abdominal pain with Lexiscan. Quantitative spect images were obtained after a 45-minute delay. Stress ECG: No significant change from baseline ECG  QPS Raw Data Images:  Normal; no motion artifact; normal heart/lung ratio. Stress Images:  small, mild inferior perfusion defect and small, mild apical perfusion defect.  Rest Images:  Small, mild inferior perfusion defect and small, mild apical perfusion defect.  Subtraction (SDS):  Small, fixed inferior and apical perfusion defects.  Transient Ischemic Dilatation (Normal <1.22):  1.12 Lung/Heart Ratio (Normal <0.45):  0.36  Quantitative Gated Spect Images QGS EDV:  73 ml QGS ESV:  17 ml  Impression Exercise Capacity:  Lexiscan with no exercise. BP Response:  Hypertensive blood pressure response. Clinical Symptoms:  Short of breath.  ECG Impression:  No significant ST segment change suggestive of ischemia. Comparison with Prior Nuclear Study: No images to compare  Overall Impression:  Low risk stress nuclear study.  Small, mild fixed inferior and apical perfusion defects may be due to soft tissue attenuation, less likely prior infarction given normal wall motion.  No ischemia.  LV Ejection Fraction: 77%.  LV Wall Motion:  NL LV Function; NL Wall Motion  Marca Ancona 07/01/2012

## 2012-07-02 ENCOUNTER — Ambulatory Visit: Payer: Medicare Other

## 2012-07-07 ENCOUNTER — Ambulatory Visit (INDEPENDENT_AMBULATORY_CARE_PROVIDER_SITE_OTHER): Payer: Medicare Other | Admitting: General Practice

## 2012-07-07 DIAGNOSIS — E538 Deficiency of other specified B group vitamins: Secondary | ICD-10-CM

## 2012-07-07 MED ORDER — CYANOCOBALAMIN 1000 MCG/ML IJ SOLN
1000.0000 ug | Freq: Once | INTRAMUSCULAR | Status: AC
  Administered 2012-07-07: 1000 ug via INTRAMUSCULAR

## 2012-07-30 ENCOUNTER — Ambulatory Visit (INDEPENDENT_AMBULATORY_CARE_PROVIDER_SITE_OTHER): Payer: Medicare Other | Admitting: General Practice

## 2012-07-30 DIAGNOSIS — D649 Anemia, unspecified: Secondary | ICD-10-CM

## 2012-07-30 DIAGNOSIS — E538 Deficiency of other specified B group vitamins: Secondary | ICD-10-CM

## 2012-07-30 MED ORDER — CYANOCOBALAMIN 1000 MCG/ML IJ SOLN
1000.0000 ug | Freq: Once | INTRAMUSCULAR | Status: AC
  Administered 2012-07-30: 1000 ug via INTRAMUSCULAR

## 2012-08-15 ENCOUNTER — Other Ambulatory Visit: Payer: Self-pay | Admitting: Internal Medicine

## 2012-08-26 ENCOUNTER — Ambulatory Visit
Admission: RE | Admit: 2012-08-26 | Discharge: 2012-08-26 | Disposition: A | Discharge status: Home / Self Care | Payer: Medicare Other | Source: Ambulatory Visit | Attending: Internal Medicine | Admitting: Internal Medicine

## 2012-08-26 ENCOUNTER — Other Ambulatory Visit: Payer: Self-pay | Admitting: Internal Medicine

## 2012-08-26 DIAGNOSIS — M25552 Pain in left hip: Secondary | ICD-10-CM

## 2012-08-27 ENCOUNTER — Ambulatory Visit: Payer: Medicare Other

## 2012-09-24 ENCOUNTER — Ambulatory Visit (INDEPENDENT_AMBULATORY_CARE_PROVIDER_SITE_OTHER): Payer: Medicare Other | Admitting: *Deleted

## 2012-09-24 DIAGNOSIS — E538 Deficiency of other specified B group vitamins: Secondary | ICD-10-CM

## 2012-09-24 MED ORDER — CYANOCOBALAMIN 1000 MCG/ML IJ SOLN
1000.0000 ug | Freq: Once | INTRAMUSCULAR | Status: AC
  Administered 2012-09-24: 1000 ug via INTRAMUSCULAR

## 2012-10-05 ENCOUNTER — Ambulatory Visit (INDEPENDENT_AMBULATORY_CARE_PROVIDER_SITE_OTHER): Payer: Medicare Other | Admitting: Internal Medicine

## 2012-10-05 ENCOUNTER — Encounter: Payer: Self-pay | Admitting: Internal Medicine

## 2012-10-05 VITALS — BP 140/60 | HR 61 | Temp 97.2°F | Resp 16 | Ht 65.0 in | Wt 186.0 lb

## 2012-10-05 DIAGNOSIS — E119 Type 2 diabetes mellitus without complications: Secondary | ICD-10-CM

## 2012-10-05 DIAGNOSIS — I10 Essential (primary) hypertension: Secondary | ICD-10-CM

## 2012-10-05 DIAGNOSIS — E785 Hyperlipidemia, unspecified: Secondary | ICD-10-CM

## 2012-10-05 MED ORDER — MELOXICAM 15 MG PO TABS
7.5000 mg | ORAL_TABLET | Freq: Every day | ORAL | Status: DC | PRN
Start: 2012-10-05 — End: 2013-01-24

## 2012-10-05 MED ORDER — METFORMIN HCL 1000 MG PO TABS: 1000.0000 mg | ORAL_TABLET | Freq: Every day | ORAL | Status: DC

## 2012-10-05 NOTE — Progress Notes (Signed)
Subjective:    Patient ID: Beverly Vargas, female    DOB: 1927-08-24, 78 y.o.   MRN: [id number]  HPI  Here for follow up - reviewed chronic med issues  DM2 - on metformin, added januvia 06/2012; reports compliance with ongoing medical treatment and no changes in medication dose or frequency. denies adverse side effects related to current therapy. checks sugars 2x/d, no symptoms or signs hypoglycemia  HTN - reviewed med list discrepancy - also following with with cards and Dr August Saucer. denies adverse side effects related to current therapy. no chest pain, headache or change in edema  dyslipidema - prev on simva, changed to atorva by cards summer 2012; reports compliance with ongoing medical treatment and no changes in medication dose or frequency. denies adverse side effects related to current therapy. no myalgias or abd pain  GERD - reports compliance with ongoing medical treatment and no changes in medication dose or frequency. denies adverse side effects related to current therapy.   Anemia, chronic - long hx iron defic - prior GI eval includes neg colo 2/05 and egd 4/07 with gastritis (clo neg) - on iron pills and dx b12 defic11/2011 - now monthly onging b12 shots - no abdominal pain, melena or brbpr  Past Medical History  Diagnosis Date  . VITAMIN B12 DEFICIENCY   . ARTHRITIS, GENERALIZED   . SCHATZKI'S RING, HX OF 12/2003    dilation to 15mm on egd  . ANXIETY   . HYPERTENSION   . HYPERLIPIDEMIA   . GERD   . DIABETES MELLITUS, TYPE II   . Nontoxic multinodular goiter   . ANEMIA-NOS     iron def, B12 defic - egd 4/07: gastritis, clo neg; colo 2/05 neg    Review of Systems  Constitutional: Positive for fatigue. Negative for fever.  Respiratory: Negative for cough.   Cardiovascular: Negative for chest pain, palpitations and leg swelling.  Musculoskeletal: Negative for joint swelling and arthralgias.       Objective:   Physical Exam  BP 140/60  Pulse 61  Temp 97.2 F (36.2  C) (Oral)  Resp 16  Ht 5\' 5"  (1.651 m)  Wt 186 lb (84.369 kg)  BMI 30.95 kg/m2  SpO2 95% Wt Readings from Last 3 Encounters:  10/05/12 186 lb (84.369 kg)  07/01/12 180 lb (81.647 kg)  06/19/12 186 lb (84.369 kg)   Constitutional: She appears well-developed and well-nourished. No distress.  Cardiovascular: Normal rate, regular rhythm and normal heart sounds.  No murmur heard. No BLE edema. Pulmonary/Chest: Effort normal and breath sounds normal. No respiratory distress. She has no wheezes.  Musculoskeletal: senile arthritic changes B hands. Normal range of motion, no joint effusions. No gross deformities Neurological: She is alert and oriented to person, place, and time. No cranial nerve deficit. Coordination normal.   Psychiatric: She has an anxious mood and affect. Her behavior is normal. Judgment and thought content normal.    Lab Results  Component Value Date   WBC 6.9 06/19/2012   HGB 11.5* 06/19/2012   HCT 35.5* 06/19/2012   PLT 348.0 06/19/2012   CHOL 234* 06/19/2012   TRIG 131.0 06/19/2012   HDL 69.90 06/19/2012   LDLDIRECT 150.2 06/19/2012   ALT 15 06/19/2012   AST 17 06/19/2012   NA 136 06/19/2012   K 4.5 06/19/2012   CL 102 06/19/2012   CREATININE 0.9 06/19/2012   BUN 18 06/19/2012   CO2 26 06/19/2012   TSH 1.06 06/19/2012   HGBA1C 7.8* 06/19/2012   Lab  Results  Component Value Date   IRON 74 06/19/2011   TIBC 295 06/19/2011        Assessment & Plan:   See problem list. Medications and labs reviewed today.

## 2012-10-05 NOTE — Assessment & Plan Note (Signed)
The current medical regimen is reasonably effective;   Not taking beta-blocker or ACEI so remove from list but encourage ARB compliance given DM.  BP Readings from Last 3 Encounters:  07/01/12 155/65  06/19/12 132/80  12/08/11 195/64

## 2012-10-05 NOTE — Assessment & Plan Note (Signed)
On statin, changed from simva to atorva by cards (harwani) The current medical regimen is effective;  continue present plan and medications.  Check lipids annually  

## 2012-10-05 NOTE — Assessment & Plan Note (Addendum)
On metformin Added januvia 06/2012 recheck a1c now - consider stopping metformin due to GI upset Lab Results  Component Value Date   HGBA1C 7.8* 06/19/2012

## 2012-10-05 NOTE — Patient Instructions (Signed)
It was good to see you today. Medications reviewed, updated today Change Metformin to one time in AM and continue Januvia once daily - both for diabetes mellitus  Use meloxicam 1/2-1 pill only as needed for arthritis pain symptoms  Test(s) ordered today. Your results will be called to you after review (48-72hours after test completion). If any changes need to be made, you will be notified at that time. Please schedule followup in 4 months, call sooner if problems.

## 2012-10-22 ENCOUNTER — Ambulatory Visit (INDEPENDENT_AMBULATORY_CARE_PROVIDER_SITE_OTHER): Payer: Medicare Other | Admitting: *Deleted

## 2012-10-22 DIAGNOSIS — E538 Deficiency of other specified B group vitamins: Secondary | ICD-10-CM

## 2012-10-22 MED ORDER — CYANOCOBALAMIN 1000 MCG/ML IJ SOLN
1000.0000 ug | Freq: Once | INTRAMUSCULAR | Status: AC
  Administered 2012-10-22: 1000 ug via INTRAMUSCULAR

## 2012-11-24 ENCOUNTER — Other Ambulatory Visit: Payer: Self-pay | Admitting: Internal Medicine

## 2012-11-24 DIAGNOSIS — Z1231 Encounter for screening mammogram for malignant neoplasm of breast: Secondary | ICD-10-CM

## 2012-11-26 ENCOUNTER — Ambulatory Visit (INDEPENDENT_AMBULATORY_CARE_PROVIDER_SITE_OTHER): Payer: Medicare Other | Admitting: *Deleted

## 2012-11-26 DIAGNOSIS — E538 Deficiency of other specified B group vitamins: Secondary | ICD-10-CM

## 2012-11-26 MED ORDER — CYANOCOBALAMIN 1000 MCG/ML IJ SOLN
1000.0000 ug | Freq: Once | INTRAMUSCULAR | Status: AC
  Administered 2012-11-26: 1000 ug via INTRAMUSCULAR

## 2012-11-27 ENCOUNTER — Ambulatory Visit: Payer: Medicare Other

## 2012-12-22 ENCOUNTER — Observation Stay (HOSPITAL_COMMUNITY)
Admission: EM | Admit: 2012-12-22 | Discharge: 2012-12-23 | Disposition: A | Discharge status: Home / Self Care | Payer: Medicare Other | Attending: Cardiology | Admitting: Cardiology

## 2012-12-22 ENCOUNTER — Encounter (HOSPITAL_COMMUNITY): Payer: Self-pay | Admitting: *Deleted

## 2012-12-22 ENCOUNTER — Other Ambulatory Visit: Payer: Self-pay

## 2012-12-22 ENCOUNTER — Emergency Department (HOSPITAL_COMMUNITY): Payer: Medicare Other

## 2012-12-22 ENCOUNTER — Emergency Department (HOSPITAL_COMMUNITY)
Admission: EM | Admit: 2012-12-22 | Discharge: 2012-12-22 | Disposition: A | Discharge status: Home / Self Care | Payer: Medicare Other | Source: Home / Self Care | Attending: Emergency Medicine | Admitting: Emergency Medicine

## 2012-12-22 ENCOUNTER — Encounter (HOSPITAL_COMMUNITY): Payer: Self-pay

## 2012-12-22 ENCOUNTER — Ambulatory Visit: Payer: Medicare Other

## 2012-12-22 DIAGNOSIS — M542 Cervicalgia: Principal | ICD-10-CM | POA: Insufficient documentation

## 2012-12-22 DIAGNOSIS — M79609 Pain in unspecified limb: Secondary | ICD-10-CM | POA: Insufficient documentation

## 2012-12-22 DIAGNOSIS — E119 Type 2 diabetes mellitus without complications: Secondary | ICD-10-CM | POA: Insufficient documentation

## 2012-12-22 DIAGNOSIS — R079 Chest pain, unspecified: Secondary | ICD-10-CM | POA: Insufficient documentation

## 2012-12-22 DIAGNOSIS — I2510 Atherosclerotic heart disease of native coronary artery without angina pectoris: Secondary | ICD-10-CM | POA: Insufficient documentation

## 2012-12-22 DIAGNOSIS — D649 Anemia, unspecified: Secondary | ICD-10-CM | POA: Insufficient documentation

## 2012-12-22 DIAGNOSIS — M1990 Unspecified osteoarthritis, unspecified site: Secondary | ICD-10-CM | POA: Insufficient documentation

## 2012-12-22 DIAGNOSIS — K219 Gastro-esophageal reflux disease without esophagitis: Secondary | ICD-10-CM | POA: Insufficient documentation

## 2012-12-22 DIAGNOSIS — I10 Essential (primary) hypertension: Secondary | ICD-10-CM | POA: Insufficient documentation

## 2012-12-22 LAB — CBC WITH DIFFERENTIAL/PLATELET
Basophils Absolute: 0 10*3/uL (ref 0.0–0.1)
Basophils Relative: 0 % (ref 0–1)
Eosinophils Absolute: 0.3 10*3/uL (ref 0.0–0.7)
MCH: 30 pg (ref 26.0–34.0)
MCHC: 34.4 g/dL (ref 30.0–36.0)
Neutrophils Relative %: 52 % (ref 43–77)
Platelets: 288 10*3/uL (ref 150–400)

## 2012-12-22 LAB — URINALYSIS, ROUTINE W REFLEX MICROSCOPIC
Bilirubin Urine: NEGATIVE
Hgb urine dipstick: NEGATIVE
Protein, ur: NEGATIVE mg/dL
Specific Gravity, Urine: 1.005 (ref 1.005–1.030)
Urobilinogen, UA: 0.2 mg/dL (ref 0.0–1.0)

## 2012-12-22 LAB — COMPREHENSIVE METABOLIC PANEL
ALT: 18 U/L (ref 0–35)
AST: 17 U/L (ref 0–37)
BUN: 14 mg/dL (ref 6–23)
CO2: 26 mEq/L (ref 19–32)
Calcium: 9.5 mg/dL (ref 8.4–10.5)
Chloride: 102 mEq/L (ref 96–112)
Creatinine, Ser: 0.81 mg/dL (ref 0.50–1.10)
GFR calc Af Amer: 76 mL/min — ABNORMAL LOW (ref >90)
GFR calc Af Amer: 66 mL/min — ABNORMAL LOW (ref >90)
GFR calc non Af Amer: 65 mL/min — ABNORMAL LOW (ref >90)
Glucose, Bld: 161 mg/dL — ABNORMAL HIGH (ref 70–99)
Sodium: 139 mEq/L (ref 135–145)
Total Bilirubin: 0.4 mg/dL (ref 0.3–1.2)
Total Protein: 7.1 g/dL (ref 6.0–8.3)

## 2012-12-22 LAB — CBC
HCT: 33.9 % — ABNORMAL LOW (ref 36.0–46.0)
MCV: 87.6 fL (ref 78.0–100.0)
RBC: 3.87 MIL/uL (ref 3.87–5.11)
WBC: 7.3 10*3/uL (ref 4.0–10.5)

## 2012-12-22 LAB — URINE MICROSCOPIC-ADD ON

## 2012-12-22 LAB — GLUCOSE, CAPILLARY: Glucose-Capillary: 168 mg/dL — ABNORMAL HIGH (ref 70–99)

## 2012-12-22 LAB — POCT I-STAT TROPONIN I

## 2012-12-22 LAB — TROPONIN I: Troponin I: <0.3 ng/mL (ref <0.30)

## 2012-12-22 LAB — PROTIME-INR
INR: 0.88 (ref 0.00–1.49)
INR: 0.9 (ref 0.00–1.49)
Prothrombin Time: 11.9 seconds (ref 11.6–15.2)
Prothrombin Time: 12.1 seconds (ref 11.6–15.2)

## 2012-12-22 MED ORDER — ONDANSETRON HCL 4 MG/2ML IJ SOLN: 4.0000 mg | Freq: Four times a day (QID) | INTRAMUSCULAR | Status: DC | PRN

## 2012-12-22 MED ORDER — NITROGLYCERIN 0.4 MG SL SUBL: 0.4000 mg | SUBLINGUAL_TABLET | SUBLINGUAL | Status: DC | PRN

## 2012-12-22 MED ORDER — OMEPRAZOLE MAGNESIUM 20 MG PO TBEC: 20.0000 mg | DELAYED_RELEASE_TABLET | Freq: Every day | ORAL | Status: DC

## 2012-12-22 MED ORDER — ADULT MULTIVITAMIN W/MINERALS CH
1.0000 | ORAL_TABLET | Freq: Every day | ORAL | Status: DC
  Administered 2012-12-22 – 2012-12-23 (×2): 1 via ORAL
  Filled 2012-12-22 (×2): qty 1

## 2012-12-22 MED ORDER — ASPIRIN 300 MG RE SUPP
300.0000 mg | RECTAL | Status: DC
  Filled 2012-12-22: qty 1

## 2012-12-22 MED ORDER — PANTOPRAZOLE SODIUM 40 MG PO TBEC
40.0000 mg | DELAYED_RELEASE_TABLET | Freq: Every day | ORAL | Status: DC
  Administered 2012-12-22 – 2012-12-23 (×2): 40 mg via ORAL
  Filled 2012-12-22 (×2): qty 1

## 2012-12-22 MED ORDER — VITAMIN D3 25 MCG (1000 UNIT) PO TABS
1000.0000 [IU] | ORAL_TABLET | Freq: Every day | ORAL | Status: DC
  Administered 2012-12-22 – 2012-12-23 (×2): 1000 [IU] via ORAL
  Filled 2012-12-22 (×2): qty 1

## 2012-12-22 MED ORDER — SODIUM CHLORIDE 0.9 % IV SOLN
20.0000 mL | INTRAVENOUS | Status: DC
  Administered 2012-12-22: 20 mL via INTRAVENOUS

## 2012-12-22 MED ORDER — ASPIRIN 81 MG PO CHEW
324.0000 mg | CHEWABLE_TABLET | Freq: Once | ORAL | Status: AC
  Administered 2012-12-22: 324 mg via ORAL

## 2012-12-22 MED ORDER — METOPROLOL TARTRATE 12.5 MG HALF TABLET
12.5000 mg | ORAL_TABLET | Freq: Two times a day (BID) | ORAL | Status: DC
  Administered 2012-12-22 – 2012-12-23 (×2): 12.5 mg via ORAL
  Filled 2012-12-22 (×3): qty 1

## 2012-12-22 MED ORDER — ASPIRIN 81 MG PO CHEW
81.0000 mg | CHEWABLE_TABLET | Freq: Every day | ORAL | Status: DC
  Administered 2012-12-23: 81 mg via ORAL
  Filled 2012-12-22 (×2): qty 1

## 2012-12-22 MED ORDER — INSULIN ASPART 100 UNIT/ML ~~LOC~~ SOLN
0.0000 [IU] | Freq: Three times a day (TID) | SUBCUTANEOUS | Status: DC
  Administered 2012-12-23: 3 [IU] via SUBCUTANEOUS
  Administered 2012-12-23: 2 [IU] via SUBCUTANEOUS

## 2012-12-22 MED ORDER — ASPIRIN 81 MG PO CHEW: 324.0000 mg | CHEWABLE_TABLET | ORAL | Status: DC

## 2012-12-22 MED ORDER — SODIUM CHLORIDE 0.9 % IV SOLN
INTRAVENOUS | Status: DC
  Administered 2012-12-22: 14:00:00 via INTRAVENOUS

## 2012-12-22 MED ORDER — ASPIRIN EC 81 MG PO TBEC
81.0000 mg | DELAYED_RELEASE_TABLET | Freq: Every day | ORAL | Status: DC
  Filled 2012-12-22: qty 1

## 2012-12-22 MED ORDER — ASPIRIN 81 MG PO CHEW: 324.0000 mg | CHEWABLE_TABLET | Freq: Once | ORAL | Status: DC

## 2012-12-22 MED ORDER — ASPIRIN 81 MG PO CHEW
CHEWABLE_TABLET | ORAL | Status: AC
  Filled 2012-12-22: qty 4

## 2012-12-22 MED ORDER — ENOXAPARIN SODIUM 40 MG/0.4ML ~~LOC~~ SOLN
40.0000 mg | SUBCUTANEOUS | Status: DC
  Filled 2012-12-22 (×2): qty 0.4

## 2012-12-22 MED ORDER — ATORVASTATIN CALCIUM 40 MG PO TABS
40.0000 mg | ORAL_TABLET | Freq: Every day | ORAL | Status: DC
  Administered 2012-12-22 – 2012-12-23 (×2): 40 mg via ORAL
  Filled 2012-12-22 (×2): qty 1

## 2012-12-22 MED ORDER — ACETAMINOPHEN 325 MG PO TABS
650.0000 mg | ORAL_TABLET | ORAL | Status: DC | PRN
  Administered 2012-12-22: 650 mg via ORAL
  Filled 2012-12-22: qty 2

## 2012-12-22 MED ORDER — LOSARTAN POTASSIUM 50 MG PO TABS
50.0000 mg | ORAL_TABLET | Freq: Every day | ORAL | Status: DC
  Administered 2012-12-22 – 2012-12-23 (×2): 50 mg via ORAL
  Filled 2012-12-22 (×2): qty 1

## 2012-12-22 NOTE — ED Notes (Signed)
Per [facility]- pt seen at urgent care for intermittent (1 wk) in left neck pain radiating to BUE, sob, diaphoresis, nausea, weakness. BP-165/74 HR-60 NSR O2-99% CBG-170 RR-16  IV placed in right hand at [facility]

## 2012-12-22 NOTE — ED Provider Notes (Signed)
Chief Complaint  Patient presents with  . Extremity Weakness    History of Present Illness:   Beverly Vargas is an 79 year old female with hypertension, type 2 diabetes mellitus, and hyperlipidemia who presents with a one-week history of pain in the left side of the neck which radiates down the left arm as far as the wrist and sometimes down the right arm as well. The pain is intermittent. It's worse if she lifts her arm and in the morning when she first gets up. She does get some relief with aspirin. She denies any anterior chest or back pain. During one of the episodes of pain she weak and sweaty. She has had some shortness of breath and nausea. She denies any cardiac history. She also complains of some rhinorrhea, hoarseness, left ankle aching, abdominal pain. She denies any fever, chills, coughing, wheezing, vomiting, or diarrhea. She has never smoked. Her mother has a history of heart disease. The patient states that she saw Dr. Sharyn Lull for a cardiac test last week, she does not know what this was for and does not know the results of the test. She also has a history of vitamin B12 deficiency, arthritis, anxiety, gastroesophageal reflux, and multinodular goiter.  Review of Systems:  Other than noted above, the patient denies any of the following symptoms. Systemic:  No fever, chills, sweats, or fatigue. ENT:  No nasal congestion, rhinorrhea, or sore throat. Pulmonary:  No cough, wheezing, shortness of breath, sputum production, hemoptysis. Cardiac:  No palpitations, rapid heartbeat, dizziness, presyncope or syncope. GI:  No abdominal pain, heartburn, nausea, or vomiting. Ext:  No leg pain or swelling.  PMFSH:  Past medical history, family history, social history, meds, and allergies were reviewed and updated as needed.   Physical Exam:   Vital signs:  BP 161/59  Pulse 65  Temp(Src) 98.1 F (36.7 C) (Oral)  Resp 16  SpO2 99% Gen:  Alert, oriented, in no distress, skin warm and dry. Eye:   PERRL, lids and conjunctivas normal.  Sclera non-icteric. ENT:  Mucous membranes moist, pharynx clear. Neck:  Supple, no adenopathy or tenderness.  No JVD. Lungs:  Clear to auscultation, no wheezes, rales or rhonchi.  No respiratory distress. Heart:  Regular rhythm.  No gallops, murmers, clicks or rubs. Chest:  No chest wall tenderness. Abdomen:  Soft, nontender, no organomegaly or mass.  Bowel sounds normal.  No pulsatile abdominal mass or bruit. Ext:  No edema.  No calf tenderness and Homann's sign negative.  Pulses full and equal. Skin:  Warm and dry.  No rash.   EKG:   Date: 12/22/2012  Rate: 58  Rhythm: sinus bradycardia  QRS Axis: normal  Intervals: normal  ST/T Wave abnormalities: nonspecific T wave changes  Conduction Disutrbances:first-degree A-V block   Narrative Interpretation: Sinus bradycardia, minimal voltage criteria for LVH, maybe normal variant, nonspecific T abnormalities with T-wave inversions in leads V1, V2, and V3.  Old EKG Reviewed: none available   Medications given in [facility]:  She was begun on normal saline intravenously at 50 mL per hour, given an aspirin 325 mg by mouth and nitroglycerin 0.4 mg sublingually. She was monitored and placed on oxygen and will be transferred to the emergency department via [facility].  Assessment:  The encounter diagnosis was Neck pain.  Differential diagnosis includes anginal equivalent.  Plan:   1.  The following meds were prescribed:   New Prescriptions   No medications on file   2.  The patient was instructed in symptomatic  care and handouts were given. 3.  The patient was transported to the emergency department via [facility] in stable condition.  Medical Decision Making:  79 year old female with HT, type 2 DM, and hypercholesterolemia presents with a 1 week history of intermittent pain in her left neck radiating to her left arm and sometimes her right arm, associated with sweats, weakness, shortness of breath, and nausea.  No  anterior chest pain, no history of cardiac disease.  She saw Dr. Sharyn Lull last week and had some kind of cardiac study, she's not sure what it was for or what it showed.  Her EKG today shows T inversions in V1 - V3.  She is being transferred due to suspicion that this is an anginal equivalent.     Reuben Likes, MD 12/22/12 1322

## 2012-12-22 NOTE — H&P (Signed)
Beverly Vargas is an 79 y.o. female.   Chief Complaint: Recurrent neck pain/left arm pain associated with nausea and diaphoresis and generalized weakness HPI: Patient is 79 year old female with past medical history significant for mild coronary artery disease in the past, hypertension, non-insulin-dependent diabetes mellitus, hypercholesteremia, degenerative joint disease, GERD, chronic anemia, was transferred to [facility] ER from urgent care Center. Patient complains of a recurrent left-sided neck pain associated with left arm pain associated with nausea and diaphoresis with generalized weakness today so decided to go to urgent care. EKG done showed normal sinus rhythm with T wave inversion in anteroseptal leads. Patient denies any chest pain. Denies any shortness of breath but does history of exertional dyspnea associated with feeling weak and tired lately. Patient denies any palpitation lightheadedness or syncope denies PND orthopnea leg swelling. Patient recently had 2-D echo done in my office which did not show significant valvular heart disease to explain fo dyspnea. Patient denies any cough fever chills or flulike symptoms. No recent cardiac stress test but had cardiac cath in the past which showed mild coronary artery disease.  Past Medical History  Diagnosis Date  . VITAMIN B12 DEFICIENCY   . ARTHRITIS, GENERALIZED   . SCHATZKI'S RING, HX OF 12/2003    dilation to 15mm on egd  . ANXIETY   . HYPERTENSION   . HYPERLIPIDEMIA   . GERD   . DIABETES MELLITUS, TYPE II   . Nontoxic multinodular goiter   . ANEMIA-NOS     iron def, B12 defic - egd 4/07: gastritis, clo neg; colo 2/05 neg    Past Surgical History  Procedure Laterality Date  . Abdominal hysterectomy      Family History  Problem Relation Age of Onset  . Arthritis Mother   . Hypertension Mother   . Diabetes Mother   . Hypertension Father   . Diabetes Father   . Arthritis Maternal Grandmother    Social History:  reports  that she has never smoked. She does not have any smokeless tobacco history on file. She reports that she does not drink alcohol or use illicit drugs.  Allergies: No Known Allergies   (Not in a hospital admission)  Results for orders placed during the hospital encounter of 12/22/12 (from the past 48 hour(s))  CBC     Status: Abnormal   Collection Time    12/22/12  3:18 PM      Result Value Range   WBC 7.3  4.0 - 10.5 K/uL   RBC 3.87  3.87 - 5.11 MIL/uL   Hemoglobin 11.5 (*) 12.0 - 15.0 g/dL   HCT 09.8 (*) 11.9 - 14.7 %   MCV 87.6  78.0 - 100.0 fL   MCH 29.7  26.0 - 34.0 pg   MCHC 33.9  30.0 - 36.0 g/dL   RDW 82.9  56.2 - 13.0 %   Platelets 292  150 - 400 K/uL  COMPREHENSIVE METABOLIC PANEL     Status: Abnormal   Collection Time    12/22/12  3:18 PM      Result Value Range   Sodium 140  135 - 145 mEq/L   Potassium 4.6  3.5 - 5.1 mEq/L   Chloride 102  96 - 112 mEq/L   CO2 26  19 - 32 mEq/L   Glucose, Bld 161 (*) 70 - 99 mg/dL   BUN 14  6 - 23 mg/dL   Creatinine, Ser 8.65  0.50 - 1.10 mg/dL   Calcium 9.7  8.4 -  10.5 mg/dL   Total Protein 7.4  6.0 - 8.3 g/dL   Albumin 3.6  3.5 - 5.2 g/dL   AST 17  0 - 37 U/L   ALT 18  0 - 35 U/L   Alkaline Phosphatase 117  39 - 117 U/L   Total Bilirubin 0.4  0.3 - 1.2 mg/dL   GFR calc non Af Amer 65 (*) >90 mL/min   GFR calc Af Amer 76 (*) >90 mL/min   Comment:            The eGFR has been calculated     using the CKD EPI equation.     This calculation has not been     validated in all clinical     situations.     eGFR's persistently     <90 mL/min signify     possible Chronic Kidney Disease.  PROTIME-INR     Status: None   Collection Time    12/22/12  3:18 PM      Result Value Range   Prothrombin Time 11.9  11.6 - 15.2 seconds   INR 0.88  0.00 - 1.49  APTT     Status: None   Collection Time    12/22/12  3:18 PM      Result Value Range   aPTT 26  24 - 37 seconds  POCT I-STAT TROPONIN I     Status: None   Collection Time     12/22/12  3:36 PM      Result Value Range   Troponin i, poc 0.00  0.00 - 0.08 ng/mL   Comment 3            Comment: Due to the release kinetics of cTnI,     a negative result within the first hours     of the onset of symptoms does not rule out     myocardial infarction with certainty.     If myocardial infarction is still suspected,     repeat the test at appropriate intervals.  URINALYSIS, ROUTINE W REFLEX MICROSCOPIC     Status: Abnormal   Collection Time    12/22/12  3:52 PM      Result Value Range   Color, Urine YELLOW  YELLOW   APPearance CLEAR  CLEAR   Specific Gravity, Urine 1.005  1.005 - 1.030   pH 6.0  5.0 - 8.0   Glucose, UA NEGATIVE  NEGATIVE mg/dL   Hgb urine dipstick NEGATIVE  NEGATIVE   Bilirubin Urine NEGATIVE  NEGATIVE   Ketones, ur NEGATIVE  NEGATIVE mg/dL   Protein, ur NEGATIVE  NEGATIVE mg/dL   Urobilinogen, UA 0.2  0.0 - 1.0 mg/dL   Nitrite NEGATIVE  NEGATIVE   Leukocytes, UA TRACE (*) NEGATIVE  URINE MICROSCOPIC-ADD ON     Status: None   Collection Time    12/22/12  3:52 PM      Result Value Range   Squamous Epithelial / LPF RARE  RARE   WBC, UA 0-2  <3 WBC/hpf   RBC / HPF 0-2  <3 RBC/hpf   Dg Chest Portable 1 View  12/22/2012  *RADIOLOGY REPORT*  Clinical Data: Chest pain.  PORTABLE CHEST - 1 VIEW  Comparison: 01/23/2011  Findings: Cardiomegaly with vascular congestion.  No overt edema. Mild hyperinflation of the lungs.  No confluent opacities or effusions.  No acute bony abnormality.  IMPRESSION: Cardiomegaly with vascular congestion.  COPD.   Original Report Authenticated By: Charlett Nose, M.D.  Review of Systems  Constitutional: Negative for fever and chills.  Eyes: Negative for blurred vision and double vision.  Respiratory: Negative for cough, hemoptysis and sputum production.   Cardiovascular: Negative for chest pain, palpitations and orthopnea.  Gastrointestinal: Positive for nausea. Negative for abdominal pain.  Genitourinary: Negative for  dysuria.  Neurological: Negative for dizziness and headaches.    Blood pressure 159/67, pulse 54, temperature 98.1 F (36.7 C), temperature source Oral, resp. rate 13, SpO2 97.00%. Physical Exam  Constitutional: She appears well-developed and well-nourished.  HENT:  Head: Normocephalic and atraumatic.  Nose: Nose normal.  Mouth/Throat: No oropharyngeal exudate.  Eyes: Conjunctivae are normal. Left eye exhibits no discharge. No scleral icterus.  Neck: Neck supple. No JVD present. No tracheal deviation present. No thyromegaly present.  Cardiovascular: Normal rate.  Exam reveals no gallop.   Murmur (Soft systolic and diastolic murmur noted) heard. Respiratory: Breath sounds normal. No respiratory distress. She has no wheezes. She has no rales.  GI: Soft. Bowel sounds are normal. She exhibits no distension. There is no tenderness. There is no rebound and no guarding.  Musculoskeletal: She exhibits no edema and no tenderness.  Neurological: She has normal reflexes.     Assessment/Plan Left neck pain/arm pain associated with generalized weakness diaphoresis probable angina equivalent rule out MI rule out cervical radiculopathy Coronary artery disease Hypertension Non-insulin-dependent diabetes mellitus GERD Degenerative joint disease Anemia Plan As per orders Schedule for nuclear stress test in a.m.   Robynn Pane 12/22/2012, 6:03 PM

## 2012-12-22 NOTE — ED Provider Notes (Signed)
History     CSN: [id number]  Arrival date & time 12/22/12  1402   First MD Initiated Contact with Patient 12/22/12 1432      Chief Complaint  Patient presents with  . Chest Pain    (Consider location/radiation/quality/duration/timing/severity/associated sxs/prior treatment) HPI  Patient states aching pain behind left ear radiates down side of neck to left elbow.  Aching , comes and goes.  This a.m. Patient states she had sweating with it.  Hurts to lay on left side sometimes.  Pain does not worsen with exertion but does get sob. Pain is currently resolved.  [facility], given aspirin and pain is resolved.  Pain lasts up to two hours.  Patient seen by Dr. Sharyn Lull last week.  States had ekg and told him she could hear her heart.  GXT several years ago and told ok.   pmd Dr. August Saucer, card Dr. Sharyn Lull Past Medical History  Diagnosis Date  . VITAMIN B12 DEFICIENCY   . ARTHRITIS, GENERALIZED   . SCHATZKI'S RING, HX OF 12/2003    dilation to 15mm on egd  . ANXIETY   . HYPERTENSION   . HYPERLIPIDEMIA   . GERD   . DIABETES MELLITUS, TYPE II   . Nontoxic multinodular goiter   . ANEMIA-NOS     iron def, B12 defic - egd 4/07: gastritis, clo neg; colo 2/05 neg    Past Surgical History  Procedure Laterality Date  . Abdominal hysterectomy      Family History  Problem Relation Age of Onset  . Arthritis Mother   . Hypertension Mother   . Diabetes Mother   . Hypertension Father   . Diabetes Father   . Arthritis Maternal Grandmother     History  Substance Use Topics  . Smoking status: Never Smoker   . Smokeless tobacco: Not on file     Comment: Divorced, lives alone- dtr lives in town. 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 Alcohol Use: No    OB History   Grav Para Term Preterm Abortions TAB SAB Ect Mult Living                  Review of Systems  HENT: Positive for congestion, rhinorrhea and postnasal  drip.   Eyes: Negative.   Respiratory: Positive for shortness of breath.   Cardiovascular: Positive for chest pain.  Gastrointestinal: Negative for nausea, vomiting and diarrhea.  Endocrine: Negative.   Genitourinary: Negative.   Allergic/Immunologic: Negative.   Neurological: Negative.   Hematological: Negative.   Psychiatric/Behavioral: Negative.   All other systems reviewed and are negative.    Allergies  Review of patient's allergies indicates no known allergies.  Home Medications   Current Outpatient Rx  Name  Route  Sig  Dispense  Refill  . amLODipine (NORVASC) 2.5 MG tablet      Take 1 by mouth daily         . aspirin 81 MG tablet   Oral   Take 81 mg by mouth daily.           Marland Kitchen atorvastatin (LIPITOR) 40 MG tablet   Oral   Take 40 mg by mouth daily.           . cyanocobalamin (,VITAMIN B-12,) 1000 MCG/ML injection   Intramuscular   Inject 1,000 mcg into the muscle every 30 (thirty) days.           . Ferrous Fumarate (  HIGH POTENCY IRON) 86 (27 FE) MG CAPS   Oral   Take 1 capsule by mouth daily.           Marland Kitchen losartan (COZAAR) 50 MG tablet               . meloxicam (MOBIC) 15 MG tablet   Oral   Take 0.5-1 tablets (7.5-15 mg total) by mouth daily as needed for pain.   30 tablet   1   . metFORMIN (GLUCOPHAGE) 1000 MG tablet   Oral   Take 1 tablet (1,000 mg total) by mouth daily with breakfast.   30 tablet   5   . omeprazole (PRILOSEC OTC) 20 MG tablet   Oral   Take 20 mg by mouth daily.           . sitaGLIPtin (JANUVIA) 50 MG tablet   Oral   Take 1 tablet (50 mg total) by mouth daily.   30 tablet   5     BP 188/52  Temp(Src) 98.1 F (36.7 C) (Oral)  Resp 18  SpO2 100%  Physical Exam  Nursing note and vitals reviewed. Constitutional: She appears well-developed and well-nourished.  HENT:  Head: Normocephalic and atraumatic.  Eyes: Conjunctivae and EOM are normal. Pupils are equal, round, and reactive to light.  Neck: Normal  range of motion. Neck supple.  Cardiovascular: Normal rate, regular rhythm, normal heart sounds and intact distal pulses.   Pulmonary/Chest: Effort normal and breath sounds normal.  Abdominal: Soft. Bowel sounds are normal.  Musculoskeletal: Normal range of motion.  Neurological: She is alert.  Skin: Skin is warm and dry.  Psychiatric: She has a normal mood and affect. Thought content normal.    ED Course  Procedures (including critical care time)  Labs Reviewed - No data to display No results found.   No diagnosis found.   Date: 12/22/2012  Rate: 78  Rhythm: normal sinus rhythm  QRS Axis: left  Intervals: normal  ST/T Wave abnormalities: t wave inversion anteriorly  Conduction Disutrbances:none  Narrative Interpretation:   Old EKG Reviewed: none available    MDM  Patient with left neck, shoulder, and upper arm pain of unclear etiology but concerning for cardiac equivalent.  Patient without significant pain here.  She has aspirin pte.  Patient's care discussed with Dr. Sharyn Lull and he will see and assess.         Hilario Quarry, MD 12/22/12 1700

## 2012-12-22 NOTE — ED Notes (Signed)
Pt reports left arm numbness, weakness that radiates into her neck and down right arm - denies chest pain, nausea, or any other cold symptoms for the past week

## 2012-12-23 ENCOUNTER — Observation Stay (HOSPITAL_COMMUNITY): Payer: Medicare Other

## 2012-12-23 LAB — LIPID PANEL
HDL: 55 mg/dL (ref >39)
Total CHOL/HDL Ratio: 4.3 RATIO
Triglycerides: 259 mg/dL — ABNORMAL HIGH (ref <150)

## 2012-12-23 LAB — TSH: TSH: 1.725 u[IU]/mL (ref 0.350–4.500)

## 2012-12-23 LAB — GLUCOSE, CAPILLARY
Glucose-Capillary: 168 mg/dL — ABNORMAL HIGH (ref 70–99)
Glucose-Capillary: 240 mg/dL — ABNORMAL HIGH (ref 70–99)

## 2012-12-23 LAB — CBC
HCT: 32.3 % — ABNORMAL LOW (ref 36.0–46.0)
MCV: 87.5 fL (ref 78.0–100.0)
Platelets: 276 10*3/uL (ref 150–400)
RBC: 3.69 MIL/uL — ABNORMAL LOW (ref 3.87–5.11)
WBC: 7.2 10*3/uL (ref 4.0–10.5)

## 2012-12-23 LAB — BASIC METABOLIC PANEL
CO2: 27 mEq/L (ref 19–32)
Chloride: 103 mEq/L (ref 96–112)
Creatinine, Ser: 0.99 mg/dL (ref 0.50–1.10)

## 2012-12-23 MED ORDER — AMLODIPINE BESYLATE 2.5 MG PO TABS
2.5000 mg | ORAL_TABLET | Freq: Every day | ORAL | Status: DC
  Administered 2012-12-23: 2.5 mg via ORAL
  Filled 2012-12-23: qty 1

## 2012-12-23 MED ORDER — TECHNETIUM TC 99M SESTAMIBI GENERIC - CARDIOLITE
10.0000 | Freq: Once | INTRAVENOUS | Status: AC | PRN
  Administered 2012-12-23: 10 via INTRAVENOUS

## 2012-12-23 MED ORDER — REGADENOSON 0.4 MG/5ML IV SOLN
0.4000 mg | Freq: Once | INTRAVENOUS | Status: AC
  Administered 2012-12-23: 0.4 mg via INTRAVENOUS
  Filled 2012-12-23: qty 5

## 2012-12-23 MED ORDER — TECHNETIUM TC 99M SESTAMIBI GENERIC - CARDIOLITE
30.0000 | Freq: Once | INTRAVENOUS | Status: AC | PRN
  Administered 2012-12-23: 30 via INTRAVENOUS

## 2012-12-23 NOTE — Progress Notes (Signed)
Utilization review completed.  

## 2012-12-23 NOTE — Discharge Summary (Signed)
  Discharge summary dictated on 12/23/2012 dictation number is [id number]

## 2012-12-23 NOTE — Discharge Summary (Signed)
NAMEBRESLYN, ABDO NO.:  [id number]  MEDICAL RECORD NO.:  [id number]  LOCATION:  3W03C                        FACILITY:  [facility]  PHYSICIAN:  Eduardo Osier. Sharyn Lull, M.D. DATE OF BIRTH:  Sep 22, 1927  DATE OF ADMISSION:  12/22/2012 DATE OF DISCHARGE:  12/23/2012                              DISCHARGE SUMMARY   ADMITTING DIAGNOSES: 1. Left neck pain, arm pain, associated with generalized weakness,     diaphoresis, probable anginal equivalent, rule out myocardial     infarction, rule out cervical radiculopathy. 2. Coronary artery disease. 3. Hypertension. 4. Non-insulin-dependent diabetes mellitus. 5. Gastroesophageal reflux disease. 6. Degenerative joint disease. 7. Anemia.  FINAL DIAGNOSES: 1. Status post left neck/arm pain, myocardial infarction ruled out,     negative Lexiscan Myoview. 2. Coronary artery disease, stable. 3. Hypertension. 4. Non-insulin-dependent diabetes mellitus. 5. Gastroesophageal reflux disease. 6. Degenerative joint disease. 7. Anemia.  MEDICATIONS:  Discharge home medications are: 1. Amlodipine 2.5 mg 1 tablet daily. 2. Enteric-coated aspirin 81 mg daily. 3. Atorvastatin 40 mg daily. 4. BC headache powder as before for pain. 5. Vitamin D 1000 units daily. 6. Vitamin B12 injection every month as before. 7. Iron 1 capsule daily. 8. Losartan 50 mg daily. 9. Meloxicam 15 mg 1/2-1 tablet daily as needed for pain. 10.Metformin 1000 mg daily with breakfast. 11.Omeprazole 20 mg daily. 12.Multivitamin with minerals 1 tablet daily. 13.Januvia 50 mg 1 tablet daily as before.  DIET:  Low salt, low cholesterol, 1800-calories ADA diet.  The patient has been advised to monitor blood pressure and blood sugar daily.  CONDITION AT DISCHARGE:  Stable.  BRIEF HISTORY AND HOSPITAL COURSE:  Ms. Beverly Vargas is an 79 year old female with past medical history significant for mild coronary artery disease in the past, hypertension, non-insulin-dependent  diabetes mellitus, hypercholesteremia, degenerative joint disease, GERD, chronic anemia, was transferred to [facility].  The patient complained of recurrent left-sided neck pain associated with left arm pain associated with nausea and diaphoresis and generalized weakness, so decided to go to Urgent Care.  EKG done in the ER showed normal sinus rhythm with T-wave inversion in anteroseptal leads.  The patient denies any chest pain, denies any shortness of breath, but does give history of exertional dyspnea associated with feeling weak and tired lately.  Denies any palpitation, lightheadedness, or syncope. Denies PND, orthopnea, or leg swelling.  The patient recently had 2D echo done in my office, did not show any significant valvular disease to explain her dyspnea with good LV systolic function.  The patient denies any cough, fever, chills, or flu-like symptoms.  No recent stress test, but had cardiac cath in the past many years ago which showed mild coronary artery disease.  PAST MEDICAL HISTORY:  As above.  PAST SURGICAL HISTORY:  She had abdominal hysterectomy in the past.  PHYSICAL EXAMINATION:  GENERAL:  She was alert, awake, oriented x3. VITAL SIGNS:  Blood pressure was 159/67, pulse was 54.  She was afebrile. HEENT:  Conjunctivae pink. NECK:  Supple.  No JVD, no bruit. LUNGS:  Clear to auscultation without rhonchi or rales. CARDIOVASCULAR:  S1, S2 was normal.  There was soft systolic and diastolic  murmur noted. ABDOMEN:  Soft.  Bowel sounds were present.  Nontender. EXTREMITIES:  There was no clubbing, cyanosis, or edema.  LABORATORY DATA:  Four sets of cardiac enzymes were negative.  Sodium was 140, potassium 4.6, BUN 14, creatinine 0.81.  Hemoglobin was 11.5, hematocrit 33.9, white count of 7.3.  Her blood sugar ranged from 170- 240.  IMAGING STUDIES:  EKG showed normal sinus rhythm with T-wave inversion in anteroseptal leads, mild LVH.   Nuclear scan showed no evidence of ischemia or infarction with normal wall motion and thickening with normal ejection fraction of 75%.  Chest x-ray showed cardiomegaly with mild vascular congestion.  BRIEF HOSPITAL COURSE:  The patient was admitted to telemetry unit.  MI was ruled out by serial enzymes and EKG.  The patient subsequently underwent Lexiscan Myoview stress test which showed no evidence of ischemia or infarction with normal LV systolic function.  The patient did not have any episodes of chest pain during the hospital stay.  Her neck and arm pain have markedly improved.  The patient will be discharged home on above medications.  If she continues to have neck, arm pain, we will consider MRI of the neck as an outpatient.     Eduardo Osier. Sharyn Lull, M.D.     [facility]  D:  12/23/2012  T:  12/23/2012  Job:  [id number]

## 2012-12-24 ENCOUNTER — Ambulatory Visit: Payer: Medicare Other | Admitting: Internal Medicine

## 2012-12-24 ENCOUNTER — Ambulatory Visit: Payer: Medicare Other

## 2013-01-05 ENCOUNTER — Ambulatory Visit (INDEPENDENT_AMBULATORY_CARE_PROVIDER_SITE_OTHER): Payer: Medicare Other | Admitting: *Deleted

## 2013-01-05 DIAGNOSIS — E538 Deficiency of other specified B group vitamins: Secondary | ICD-10-CM

## 2013-01-05 MED ORDER — CYANOCOBALAMIN 1000 MCG/ML IJ SOLN
1000.0000 ug | Freq: Once | INTRAMUSCULAR | Status: AC
  Administered 2013-01-05: 1000 ug via INTRAMUSCULAR

## 2013-01-24 ENCOUNTER — Other Ambulatory Visit: Payer: Self-pay | Admitting: Internal Medicine

## 2013-01-26 ENCOUNTER — Ambulatory Visit: Payer: Medicare Other

## 2013-02-01 ENCOUNTER — Ambulatory Visit (INDEPENDENT_AMBULATORY_CARE_PROVIDER_SITE_OTHER): Payer: Medicare Other | Admitting: Internal Medicine

## 2013-02-01 ENCOUNTER — Ambulatory Visit (INDEPENDENT_AMBULATORY_CARE_PROVIDER_SITE_OTHER)
Admission: RE | Admit: 2013-02-01 | Discharge: 2013-02-01 | Disposition: A | Discharge status: Home / Self Care | Payer: Medicare Other | Source: Ambulatory Visit | Attending: Internal Medicine | Admitting: Internal Medicine

## 2013-02-01 ENCOUNTER — Encounter: Payer: Self-pay | Admitting: Internal Medicine

## 2013-02-01 VITALS — BP 120/72 | HR 71 | Temp 98.1°F | Wt 190.5 lb

## 2013-02-01 DIAGNOSIS — E119 Type 2 diabetes mellitus without complications: Secondary | ICD-10-CM

## 2013-02-01 DIAGNOSIS — M5412 Radiculopathy, cervical region: Secondary | ICD-10-CM

## 2013-02-01 DIAGNOSIS — E785 Hyperlipidemia, unspecified: Secondary | ICD-10-CM

## 2013-02-01 DIAGNOSIS — E538 Deficiency of other specified B group vitamins: Secondary | ICD-10-CM

## 2013-02-01 DIAGNOSIS — I10 Essential (primary) hypertension: Secondary | ICD-10-CM

## 2013-02-01 DIAGNOSIS — M5010 Cervical disc disorder with radiculopathy, unspecified cervical region: Secondary | ICD-10-CM

## 2013-02-01 MED ORDER — CYANOCOBALAMIN 1000 MCG/ML IJ SOLN
1000.0000 ug | Freq: Once | INTRAMUSCULAR | Status: AC
  Administered 2013-02-01: 1000 ug via INTRAMUSCULAR

## 2013-02-01 NOTE — Patient Instructions (Signed)
It was good to see you today. Medications reviewed, updated today Continue metformin one daily in AM and Januvia once daily - take both for diabetes mellitus  Test(s) ordered today. Your results will be called to you after review (48-72hours after test completion). If any changes need to be made, you will be notified at that time. we'll make referral to spine specialist for your neck and arm pain . Our office will contact you regarding appointment(s) once made. Please schedule followup in 3 months, call sooner if problems.

## 2013-02-01 NOTE — Progress Notes (Signed)
Subjective:    Patient ID: Beverly Vargas, female    DOB: Oct 01, 1927, 79 y.o.   MRN: [id number]  HPI  Here for follow up -   Reports continued left arm and neck pain since hospitalization for same February 2014 Denies weakness or falls, but constant burning and shooting pain from neck into the forearm Pain symptoms improved with resting arm overhead Pain in left arm worse lying supine, especially on the right side  Also reviewed chronic med issues  DM2 - on metformin, added januvia 06/2012; reports variable compliance with ongoing medical treatment and no changes in medication dose or frequency. denies adverse side effects related to current therapy. checks sugars 2x/d, no symptoms or signs hypoglycemia  HTN- also following with with cards. denies adverse side effects related to current therapy. no chest pain, headache or change in edema  dyslipidema - prev on simva, changed to atorva by cards summer 2012; reports compliance with ongoing medical treatment and no changes in medication dose or frequency. denies adverse side effects related to current therapy.  GERD - reports compliance with ongoing medical treatment and no changes in medication dose or frequency. denies adverse side effects related to current therapy.   Anemia, chronic - long hx iron defic - prior GI eval includes neg colo 2/05 and egd 4/07 with gastritis (clo neg) - on iron pills and dx b12 defic11/2011 - now monthly onging b12 shots - no abdominal pain, melena or brbpr  Past Medical History  Diagnosis Date  . VITAMIN B12 DEFICIENCY   . ARTHRITIS, GENERALIZED   . SCHATZKI'S RING, HX OF 12/2003    dilation to 15mm on egd  . ANXIETY   . HYPERTENSION   . HYPERLIPIDEMIA   . GERD   . DIABETES MELLITUS, TYPE II   . Nontoxic multinodular goiter   . ANEMIA-NOS     iron def, B12 defic - egd 4/07: gastritis, clo neg; colo 2/05 neg    Review of Systems  Constitutional: Positive for fatigue. Negative for fever.   Respiratory: Negative for cough.   Cardiovascular: Negative for chest pain, palpitations and leg swelling.  Musculoskeletal: Negative for joint swelling and arthralgias.       Objective:   Physical Exam  BP 120/72  Pulse 71  Temp(Src) 98.1 F (36.7 C) (Oral)  Wt 190 lb 8 oz (86.41 kg)  BMI 31.7 kg/m2  SpO2 96% Wt Readings from Last 3 Encounters:  02/01/13 190 lb 8 oz (86.41 kg)  12/23/12 188 lb 6.4 oz (85.458 kg)  10/05/12 186 lb (84.369 kg)   Constitutional: She is overweight, but appears well-developed and well-nourished. No distress.  Cardiovascular: Normal rate, regular rhythm and normal heart sounds.  No murmur heard. No BLE edema. Pulmonary/Chest: Effort normal and breath sounds normal. No respiratory distress. She has no wheezes.  Musculoskeletal: senile arthritic changes B hands. Normal range of motion, no joint effusions. No gross deformities. Normal left shoulder exam, nontender to palpation with full range of motion Neurological: She is alert and oriented to person, place, and time. No cranial nerve deficit. Coordination, gait and balance are normal. good normal grip strength bilaterally. Negative Romberg. Left arm symptoms improved with resting left upper extremity overhead  Psychiatric: She has an anxious mood and affect. Her behavior is normal. Judgment and thought content normal.    Lab Results  Component Value Date   WBC 7.2 12/23/2012   HGB 10.8* 12/23/2012   HCT 32.3* 12/23/2012   PLT 276 12/23/2012   CHOL  238* 12/23/2012   TRIG 259* 12/23/2012   HDL 55 12/23/2012   LDLDIRECT 150.2 06/19/2012   ALT 18 12/22/2012   AST 17 12/22/2012   NA 139 12/23/2012   K 4.8 12/23/2012   CL 103 12/23/2012   CREATININE 0.99 12/23/2012   BUN 20 12/23/2012   CO2 27 12/23/2012   TSH 1.725 12/22/2012   INR 0.90 12/22/2012   HGBA1C 8.0* 12/22/2012   Lab Results  Component Value Date   IRON 74 06/19/2011   TIBC 295 06/19/2011        Assessment & Plan:   Left arm pain and neck pain -  history and exam consistent with cervical radiculopathy Overnight hospitalization February 2014 for same symptoms reviewed, no cardiac abnormality identified Persisting discomfort for several months, unimproved with conservative care (anti-inflammatory) Check plain film today to evaluate for DDD Refer to orthospine to consider treatment of same  Also see problem list. Medications and labs reviewed today.

## 2013-02-01 NOTE — Assessment & Plan Note (Signed)
The current medical regimen is reasonably effective;  encouraged ARB compliance given DM.   BP Readings from Last 3 Encounters:  02/01/13 120/72  12/23/12 149/70  12/22/12 161/59

## 2013-02-01 NOTE — Assessment & Plan Note (Signed)
On statin, changed from simva to atorva by cards (harwani) The current medical regimen is effective;  continue present plan and medications.  Check lipids annually

## 2013-02-01 NOTE — Assessment & Plan Note (Signed)
On metformin Added januvia 06/2012, but inconsistent use of same There have been some probable medication, dietary and lifestyle compliance issues here. I have discussed with her the great importance of following the treatment plan exactly as directed in order to achieve a good medical outcome.  Lab Results  Component Value Date   HGBA1C 8.0* 12/22/2012

## 2013-02-08 ENCOUNTER — Encounter: Payer: Self-pay | Admitting: General Practice

## 2013-02-25 ENCOUNTER — Ambulatory Visit
Admission: RE | Admit: 2013-02-25 | Discharge: 2013-02-25 | Disposition: A | Discharge status: Home / Self Care | Payer: Medicare Other | Source: Ambulatory Visit | Attending: Internal Medicine | Admitting: Internal Medicine

## 2013-02-25 DIAGNOSIS — Z1231 Encounter for screening mammogram for malignant neoplasm of breast: Secondary | ICD-10-CM

## 2013-03-02 ENCOUNTER — Encounter (HOSPITAL_COMMUNITY): Payer: Self-pay | Admitting: Emergency Medicine

## 2013-03-02 ENCOUNTER — Emergency Department (HOSPITAL_COMMUNITY)
Admission: EM | Admit: 2013-03-02 | Discharge: 2013-03-02 | Disposition: A | Discharge status: Home / Self Care | Payer: Medicare Other | Attending: Emergency Medicine | Admitting: Emergency Medicine

## 2013-03-02 DIAGNOSIS — R509 Fever, unspecified: Secondary | ICD-10-CM | POA: Insufficient documentation

## 2013-03-02 DIAGNOSIS — R51 Headache: Secondary | ICD-10-CM | POA: Insufficient documentation

## 2013-03-02 DIAGNOSIS — F411 Generalized anxiety disorder: Secondary | ICD-10-CM | POA: Insufficient documentation

## 2013-03-02 DIAGNOSIS — Z8719 Personal history of other diseases of the digestive system: Secondary | ICD-10-CM | POA: Insufficient documentation

## 2013-03-02 DIAGNOSIS — E119 Type 2 diabetes mellitus without complications: Secondary | ICD-10-CM | POA: Insufficient documentation

## 2013-03-02 DIAGNOSIS — E785 Hyperlipidemia, unspecified: Secondary | ICD-10-CM | POA: Insufficient documentation

## 2013-03-02 DIAGNOSIS — I10 Essential (primary) hypertension: Secondary | ICD-10-CM | POA: Insufficient documentation

## 2013-03-02 DIAGNOSIS — E538 Deficiency of other specified B group vitamins: Secondary | ICD-10-CM | POA: Insufficient documentation

## 2013-03-02 DIAGNOSIS — Z7982 Long term (current) use of aspirin: Secondary | ICD-10-CM | POA: Insufficient documentation

## 2013-03-02 DIAGNOSIS — Z8639 Personal history of other endocrine, nutritional and metabolic disease: Secondary | ICD-10-CM | POA: Insufficient documentation

## 2013-03-02 DIAGNOSIS — Z8739 Personal history of other diseases of the musculoskeletal system and connective tissue: Secondary | ICD-10-CM | POA: Insufficient documentation

## 2013-03-02 DIAGNOSIS — Z862 Personal history of diseases of the blood and blood-forming organs and certain disorders involving the immune mechanism: Secondary | ICD-10-CM | POA: Insufficient documentation

## 2013-03-02 DIAGNOSIS — R61 Generalized hyperhidrosis: Secondary | ICD-10-CM | POA: Insufficient documentation

## 2013-03-02 DIAGNOSIS — B029 Zoster without complications: Secondary | ICD-10-CM

## 2013-03-02 DIAGNOSIS — K219 Gastro-esophageal reflux disease without esophagitis: Secondary | ICD-10-CM | POA: Insufficient documentation

## 2013-03-02 DIAGNOSIS — R5381 Other malaise: Secondary | ICD-10-CM | POA: Insufficient documentation

## 2013-03-02 DIAGNOSIS — Z79899 Other long term (current) drug therapy: Secondary | ICD-10-CM | POA: Insufficient documentation

## 2013-03-02 MED ORDER — VALACYCLOVIR HCL 1 G PO TABS: 1000.0000 mg | ORAL_TABLET | Freq: Three times a day (TID) | ORAL | Status: DC

## 2013-03-02 NOTE — ED Notes (Signed)
Pt c/o of burning rash with blisters on left flank.VSS

## 2013-03-02 NOTE — ED Provider Notes (Signed)
History    This chart was scribed for non-physician practitioner working with Beverly Sprout, MD by [name], ED Scribe. This patient was seen in room WTR9/WTR9 and the patient's care was started at 8:58PM   CSN: [id number]  Arrival date & time 03/02/13  2016   First MD Initiated Contact with Patient 03/02/13 2058      Chief Complaint  Patient presents with  . Rash    (Consider location/radiation/quality/duration/timing/severity/associated sxs/prior treatment) The history is provided by the patient. No language interpreter was used.    Beverly Vargas is a 79 y.o. female , with a hx of arthritis, anxiety, hypertension, hyperlipidemia, GERD, diabetes mellitus, anemia, and abdominal hysterectomy, who presents to the Emergency Department complaining of sudden, progressively worsening, rash, located at the left thoracic region, onset two days ago (02/28/13).  Associated symptoms include fever (subjectuve PTA, 98.6 at [facility] on 03/02/13), sweats, diffuse weakness, and diffuse headache. The pt reports she has been feeling weak and sick for the past two days, culminating in a painful, burning, rash, located at her left flank. The appearance of the painful rash has prompted the pt's concern and desire to seek medical evaluation at [facility] this evening. Furthermore, the pt informs she did indeed have the chicken pox as a child.  The pt denies nausea and vomiting.   The pt does not smoke or drink alcohol.   PCP is Dr. Felicity Coyer.    Past Medical History  Diagnosis Date  . VITAMIN B12 DEFICIENCY   . ARTHRITIS, GENERALIZED   . SCHATZKI'S RING, HX OF 12/2003    dilation to 15mm on egd  . ANXIETY   . HYPERTENSION   . HYPERLIPIDEMIA   . GERD   . DIABETES MELLITUS, TYPE II   . Nontoxic multinodular goiter   . ANEMIA-NOS     iron def, B12 defic - egd 4/07: gastritis, clo neg; colo 2/05 neg    Past Surgical History  Procedure Laterality Date  . Abdominal hysterectomy      Family History   Problem Relation Age of Onset  . Arthritis Mother   . Hypertension Mother   . Diabetes Mother   . Hypertension Father   . Diabetes Father   . Arthritis Maternal Grandmother     History  Substance Use Topics  . Smoking status: Never Smoker   . Smokeless tobacco: Not on file     Comment: Divorced, lives alone- dtr lives in town. 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 Alcohol Use: No    OB History   Grav Para Term Preterm Abortions TAB SAB Ect Mult Living                  Review of Systems  Constitutional: Positive for fever (subjective).  Gastrointestinal: Negative for nausea and vomiting.  Skin: Positive for rash.  Neurological: Positive for headaches.  All other systems reviewed and are negative.    Allergies  Review of patient's allergies indicates no known allergies.  Home Medications   Current Outpatient Rx  Name  Route  Sig  Dispense  Refill  . amLODipine (NORVASC) 2.5 MG tablet   Oral   Take 2.5 mg by mouth every evening.          Marland Kitchen aspirin 81 MG tablet   Oral   Take 81 mg by mouth every evening.          . Aspirin-Salicylamide-Caffeine (BC HEADACHE POWDER PO)   Oral  Take 1 packet by mouth daily as needed (for pain).         Marland Kitchen atorvastatin (LIPITOR) 40 MG tablet   Oral   Take 40 mg by mouth every evening.          . cholecalciferol (VITAMIN D) 1000 UNITS tablet   Oral   Take 1,000 Units by mouth every evening.          . cyanocobalamin (,VITAMIN B-12,) 1000 MCG/ML injection   Intramuscular   Inject 1,000 mcg into the muscle every 30 (thirty) days.           . Ferrous Fumarate (HIGH POTENCY IRON) 86 (27 FE) MG CAPS   Oral   Take 1 capsule by mouth every evening.          Marland Kitchen losartan (COZAAR) 50 MG tablet   Oral   Take 50 mg by mouth every evening.          . meloxicam (MOBIC) 7.5 MG tablet   Oral   Take 7.5 mg by mouth daily as needed for pain.         . metFORMIN  (GLUCOPHAGE) 1000 MG tablet   Oral   Take 1 tablet (1,000 mg total) by mouth daily with breakfast.   30 tablet   5   . Multiple Vitamin (MULTIVITAMIN WITH MINERALS) TABS   Oral   Take 1 tablet by mouth daily.         Marland Kitchen omeprazole (PRILOSEC OTC) 20 MG tablet   Oral   Take 20 mg by mouth daily.             BP 147/76  Pulse 87  Temp(Src) 98.6 F (37 C) (Oral)  SpO2 100%  Physical Exam  Nursing note and vitals reviewed. Constitutional: She is oriented to person, place, and time. She appears well-developed and well-nourished. No distress.  HENT:  Head: Normocephalic and atraumatic.  Right Ear: External ear normal.  Left Ear: External ear normal.  Nose: Nose normal.  Mouth/Throat: Oropharynx is clear and moist.  Eyes: Conjunctivae are normal.  Neck: Normal range of motion.  Cardiovascular: Normal rate, regular rhythm and normal heart sounds.   Pulmonary/Chest: Effort normal and breath sounds normal. No stridor. No respiratory distress. She has no wheezes. She has no rales.  Abdominal: Soft. She exhibits no distension.  Musculoskeletal: Normal range of motion.  Neurological: She is alert and oriented to person, place, and time. She has normal strength.  Skin: Skin is warm and dry. Rash noted. She is not diaphoretic. No erythema.  3 cm patch of vesicles with an erythematous base located at the left thoracic region in a dermatomal pattern.   Psychiatric: She has a normal mood and affect. Her behavior is normal.    ED Course  Procedures (including critical care time)  DIAGNOSTIC STUDIES: Oxygen Saturation is 100% on room air, normal by my interpretation.    COORDINATION OF CARE:   9:03 PM- Treatment plan discussed with patient. Pt agrees with treatment.      Labs Reviewed - No data to display No results found.   1. Herpes zoster       MDM  Patient presents today with herpetic rash in dermatomal pattern. Patient is afebrile. NAD. Rash is consistent with  herpes zoster. She was given valtrex and will follow up with here PCP. Return instructions given. Vital signs stable for discharge. Patient / Family / Caregiver understand and agree with initial ED impression and plan with expectations set  for ED visit.       I personally performed the services described in this documentation, which was scribed in my presence. The recorded information has been reviewed and is accurate.    Mora Bellman, PA-C 03/03/13 1248

## 2013-03-04 ENCOUNTER — Ambulatory Visit (INDEPENDENT_AMBULATORY_CARE_PROVIDER_SITE_OTHER): Payer: Medicare Other | Admitting: *Deleted

## 2013-03-04 DIAGNOSIS — E538 Deficiency of other specified B group vitamins: Secondary | ICD-10-CM

## 2013-03-04 MED ORDER — CYANOCOBALAMIN 1000 MCG/ML IJ SOLN
1000.0000 ug | Freq: Once | INTRAMUSCULAR | Status: AC
  Administered 2013-03-04: 1000 ug via INTRAMUSCULAR

## 2013-03-04 NOTE — ED Provider Notes (Signed)
Medical screening examination/treatment/procedure(s) were performed by non-physician practitioner and as supervising physician I was immediately available for consultation/collaboration.   [name], MD 03/04/13 [phone]

## 2013-03-10 ENCOUNTER — Ambulatory Visit (INDEPENDENT_AMBULATORY_CARE_PROVIDER_SITE_OTHER): Payer: Medicare Other | Admitting: Internal Medicine

## 2013-03-10 ENCOUNTER — Encounter: Payer: Self-pay | Admitting: Internal Medicine

## 2013-03-10 VITALS — BP 142/80 | HR 67 | Temp 98.0°F | Wt 189.4 lb

## 2013-03-10 DIAGNOSIS — R0789 Other chest pain: Secondary | ICD-10-CM

## 2013-03-10 DIAGNOSIS — B029 Zoster without complications: Secondary | ICD-10-CM

## 2013-03-10 DIAGNOSIS — N318 Other neuromuscular dysfunction of bladder: Secondary | ICD-10-CM

## 2013-03-10 DIAGNOSIS — N3281 Overactive bladder: Secondary | ICD-10-CM

## 2013-03-10 DIAGNOSIS — R071 Chest pain on breathing: Secondary | ICD-10-CM

## 2013-03-10 MED ORDER — MIRABEGRON ER 25 MG PO TB24: 25.0000 mg | ORAL_TABLET | Freq: Every day | ORAL | Status: DC

## 2013-03-10 NOTE — Patient Instructions (Signed)
It was good to see you today. We have reviewed your prior records including labs and tests today Start new medication once daily for your bladder symptoms - Your prescription(s) have been submitted to your pharmacy. Please take as directed and contact our office if you believe you are having problem(s) with the medication(s). Other medications reviewed and updated, no additional changes recommended Keep followup in 6 months as scheduled, call sooner if problems

## 2013-03-10 NOTE — Progress Notes (Signed)
  Subjective:    Patient ID: Beverly Vargas, female    DOB: Dec 26, 1926, 79 y.o.   MRN: [id number]  HPI  79 y.o female presents to the office for follow-up after visiting the ED with painful rash on 03/02/13.  Patient was diagnosed with Herpes Zoster and was given Valtrex 1,000 mg.  She states that she is "okay" since that time.  She finished her Valtrex.  She has been using Gold bond pain relieving cream, and calamine lotion on the rash.  She still has some "pulling" sensation in her chest.  She says that it feels almost like gas pain.  It is intermittent and lasts for a few hours at a time.  It happens most often when she is relaxing, and doesn't occur with activity.  Tried some bengay at home to relieve the pain.       Patient also complains of some bladder leakage.  She feels a strong urge to go and if she can't get there soon enough.  She says she can't "hold it" anymore.  She denies, dysuria, foul smelling urine, hematuria, and frequency.          Review of Systems  Constitutional: Negative for fever, chills and fatigue.  Genitourinary: Positive for urgency. Negative for dysuria, frequency, hematuria and flank pain.  Neurological: Negative for dizziness, syncope, weakness, light-headedness and headaches.  All other systems reviewed and are negative.       Objective:   Physical Exam  Nursing note and vitals reviewed. Constitutional: She is oriented to person, place, and time. She appears well-developed and well-nourished. No distress.  Eyes: Conjunctivae are normal. Right eye exhibits no discharge. Left eye exhibits no discharge. No scleral icterus.  Cardiovascular: Normal rate and regular rhythm.  Exam reveals no gallop and no friction rub.   Murmur heard. Pulmonary/Chest: Effort normal and breath sounds normal. No respiratory distress. She has no wheezes. She has no rales. She exhibits no tenderness.  Neurological: She is alert and oriented to person, place, and time.  Skin: Skin is  warm and dry. Rash noted. She is not diaphoretic.     Psychiatric: She has a normal mood and affect. Her behavior is normal.          Assessment & Plan:  79 y.o. Woman presents to the office for follow up after recent diagnosis of Herpes Zoster at the ED.  Herpes Zoster:  S/p 7d valtrex - clinically resolving.  Okay to continue using calamine lotion and goldbond cream prn.  Chest pain:  Pain is likely musculoskeletal in origin.  Patient recently had a stress test with Dr. Sharyn Lull within the past couple months that was within normal limits.  Patient was cautioned that if symptoms get worse and she experiences chest pain with radiation down the arm, nausea, vomiting, dizziness, and sweating she should seek medical care.  If symptoms do not improve further workup will be done.  Overactive bladder:  Patient was prescribed Mirabegron for overactive bladder and counseled on behavioral modifications like trying to use the bathroom even when the bladder doesn't feel full.     I have personally reviewed this case with PA student. I also personally examined this patient. I agree with history and findings as documented above. I reviewed, discussed and approve of the assessment and plan as listed above. Rene Paci, MD

## 2013-03-10 NOTE — Progress Notes (Signed)
Subjective:    Patient ID: Beverly Vargas, female    DOB: December 29, 1926, 79 y.o.   MRN: [id number]  HPI  Here for ER follow up - recent eval for painful rash - dx with shingles Tx with valtrex - rash is improved and pain is controlled  Also continued "pull" on L chest - hx same, improved with OTC Bengay  Also complains of OAB symptoms - "leak" if late getting start towards BR  Past Medical History  Diagnosis Date  . VITAMIN B12 DEFICIENCY   . ARTHRITIS, GENERALIZED   . SCHATZKI'S RING, HX OF 12/2003    dilation to 15mm on egd  . ANXIETY   . HYPERTENSION   . HYPERLIPIDEMIA   . GERD   . DIABETES MELLITUS, TYPE II   . Nontoxic multinodular goiter   . ANEMIA-NOS     iron def, B12 defic - egd 4/07: gastritis, clo neg; colo 2/05 neg   Family History  Problem Relation Age of Onset  . Arthritis Mother   . Hypertension Mother   . Diabetes Mother   . Hypertension Father   . Diabetes Father   . Arthritis Maternal Grandmother    History  Substance Use Topics  . Smoking status: Never Smoker   . Smokeless tobacco: Not on file     Comment: Divorced, lives alone- dtr lives in town. 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 Alcohol Use: No    Review of Systems Constitutional: Negative for fever or weight change.  Respiratory: Negative for cough and shortness of breath.   Cardiovascular: Negative for palpitations.  Gastrointestinal: Negative for abdominal pain, no bowel changes.  Musculoskeletal: Negative for gait problem or joint swelling.  Skin: Negative for rash.  Neurological: Negative for dizziness or headache.  No other specific complaints in a complete review of systems (except as listed in HPI above).     Objective:   Physical Exam BP 142/80  Pulse 67  Temp(Src) 98 F (36.7 C) (Oral)  Wt 189 lb 6.4 oz (85.911 kg)  BMI 31.52 kg/m2  SpO2 95% Wt Readings from Last 3 Encounters:  03/10/13 189 lb 6.4 oz (85.911 kg)   11/20/12 190 lb (86.183 kg)  02/01/13 190 lb 8 oz (86.41 kg)   Constitutional: She appears well-developed and well-nourished. No distress.   Neck: Normal range of motion. Neck supple. No JVD present. No thyromegaly present.  Cardiovascular: Normal rate, regular rhythm and normal heart sounds.  No murmur heard. No BLE edema. Pulmonary/Chest: Effort normal and breath sounds normal. No respiratory distress. She has no wheezes.  Musculoskeletal: Normal range of motion, no joint effusions. No gross deformities Neurological: She is alert and oriented to person, place, and time. No cranial nerve deficit. Coordination, balance, strength, speech and gait are normal.  Skin: resolving shingles rash outbreak and dermatomal pattern on upper left abdomen - remaining skin is warm and dry. No rash noted. No erythema.  Psychiatric: She has a normal mood and affect. Her behavior is normal. Judgment and thought content normal.   Lab Results  Component Value Date   WBC 7.2 12/23/2012   HGB 10.8* 12/23/2012   HCT 32.3* 12/23/2012   PLT 276 12/23/2012   GLUCOSE 176* 12/23/2012   CHOL 238* 12/23/2012   TRIG 259* 12/23/2012   HDL 55 12/23/2012   LDLDIRECT 150.2 06/19/2012   LDLCALC 131* 12/23/2012   ALT 18 12/22/2012   AST 17 12/22/2012   NA 139 12/23/2012  K 4.8 12/23/2012   CL 103 12/23/2012   CREATININE 0.99 12/23/2012   BUN 20 12/23/2012   CO2 27 12/23/2012   TSH 1.725 12/22/2012   INR 0.90 12/22/2012   HGBA1C 8.0* 12/22/2012      Assessment & Plan:   Shingles - appropriately improving - dx reviewed and reassurance provided  L chest wall pain - reviewed negative nuc stress test from 12/2012 - reassured symptoms likely MSkel as improved with Bengay - continue same with NSAIDs as needed and call if worse or unimproved  OAB - start bladder agent - call if worse or unimproved

## 2013-03-30 ENCOUNTER — Encounter: Payer: Self-pay | Admitting: Internal Medicine

## 2013-03-30 ENCOUNTER — Other Ambulatory Visit (INDEPENDENT_AMBULATORY_CARE_PROVIDER_SITE_OTHER): Payer: Medicare Other

## 2013-03-30 ENCOUNTER — Ambulatory Visit (INDEPENDENT_AMBULATORY_CARE_PROVIDER_SITE_OTHER): Payer: Medicare Other | Admitting: Internal Medicine

## 2013-03-30 VITALS — BP 140/72 | HR 76 | Temp 98.9°F | Wt 183.0 lb

## 2013-03-30 DIAGNOSIS — D649 Anemia, unspecified: Secondary | ICD-10-CM

## 2013-03-30 DIAGNOSIS — E1142 Type 2 diabetes mellitus with diabetic polyneuropathy: Secondary | ICD-10-CM

## 2013-03-30 DIAGNOSIS — E119 Type 2 diabetes mellitus without complications: Secondary | ICD-10-CM

## 2013-03-30 DIAGNOSIS — E1149 Type 2 diabetes mellitus with other diabetic neurological complication: Secondary | ICD-10-CM

## 2013-03-30 DIAGNOSIS — E538 Deficiency of other specified B group vitamins: Secondary | ICD-10-CM

## 2013-03-30 DIAGNOSIS — I10 Essential (primary) hypertension: Secondary | ICD-10-CM

## 2013-03-30 LAB — CBC WITH DIFFERENTIAL/PLATELET
Basophils Relative: 0.4 % (ref 0.0–3.0)
Eosinophils Absolute: 0.3 10*3/uL (ref 0.0–0.7)
Eosinophils Relative: 3.5 % (ref 0.0–5.0)
HCT: 35.1 % — ABNORMAL LOW (ref 36.0–46.0)
Hemoglobin: 11.9 g/dL — ABNORMAL LOW (ref 12.0–15.0)
Lymphs Abs: 1.7 10*3/uL (ref 0.7–4.0)
MCHC: 33.8 g/dL (ref 30.0–36.0)
MCV: 87.2 fl (ref 78.0–100.0)
Monocytes Absolute: 0.7 10*3/uL (ref 0.1–1.0)
Neutro Abs: 4.8 10*3/uL (ref 1.4–7.7)
Neutrophils Relative %: 64.3 % (ref 43.0–77.0)
RBC: 4.02 Mil/uL (ref 3.87–5.11)
WBC: 7.4 10*3/uL (ref 4.5–10.5)

## 2013-03-30 LAB — MICROALBUMIN / CREATININE URINE RATIO: Microalb, Ur: 2.2 mg/dL — ABNORMAL HIGH (ref 0.0–1.9)

## 2013-03-30 MED ORDER — METFORMIN HCL 1000 MG PO TABS: 1000.0000 mg | ORAL_TABLET | Freq: Every day | ORAL | Status: DC

## 2013-03-30 MED ORDER — AMLODIPINE BESYLATE 5 MG PO TABS: 5.0000 mg | ORAL_TABLET | Freq: Every day | ORAL | Status: DC

## 2013-03-30 MED ORDER — CYANOCOBALAMIN 1000 MCG/ML IJ SOLN
1000.0000 ug | Freq: Once | INTRAMUSCULAR | Status: AC
  Administered 2013-03-30: 1000 ug via INTRAMUSCULAR

## 2013-03-30 MED ORDER — MELOXICAM 7.5 MG PO TABS: 7.5000 mg | ORAL_TABLET | Freq: Every day | ORAL | Status: DC | PRN

## 2013-03-30 NOTE — Assessment & Plan Note (Signed)
On metformin Added januvia 06/2012, but inconsistent use of same due to cost There have been some probable medication, dietary and lifestyle compliance issues here. I have discussed with Beverly Vargas the great importance of following the treatment plan exactly as directed in order to achieve a good medical outcome. Check a1c q3-21mo and adjust as needed  Lab Results  Component Value Date   HGBA1C 8.0* 12/22/2012

## 2013-03-30 NOTE — Assessment & Plan Note (Signed)
Iron defic chronic and B12 defic dx 09/2010 - ?celiac On oral iron and B12 shots -  Prior GI eval reviewed: EGD 4/07 with gastritis, clo neg and colo 2/05 negative Check cbc now, esp with daily meloxicam for OA as rx'd 01/2013 

## 2013-03-30 NOTE — Assessment & Plan Note (Signed)
The current medical regimen is reasonably effective;  encouraged ARB compliance given DM.  Also titrate up amlodipine dose now - erx done  BP Readings from Last 3 Encounters:  03/30/13 140/72  03/10/13 142/80  03/02/13 147/76

## 2013-03-30 NOTE — Addendum Note (Signed)
Addended by: Rene Paci A on: 03/30/2013 01:28 PM   Modules accepted: Orders

## 2013-03-30 NOTE — Assessment & Plan Note (Signed)
Ongoing B12 monthly injections  Lab Results  Component Value Date   [id number]* 10/08/2010

## 2013-03-30 NOTE — Progress Notes (Signed)
Subjective:    Patient ID: Beverly Vargas, female    DOB: [birth year], 79 y.o.   MRN: [id number]  HPI  Here for follow up - reviewed chronic med issues  DM2 - on metformin, added januvia 06/2012 but stopped same because of cost; reports variable compliance with ongoing medical treatment and no changes in medication dose or frequency. denies adverse side effects related to current therapy. checks sugars 2x/d, no symptoms or signs hypoglycemia  HTN- also following with with cards. denies adverse side effects related to current therapy. no chest pain, headache or change in edema  dyslipidema - prev on simva, changed to atorva by cards summer 2012; reports variable compliance with ongoing medical treatment and no changes in medication dose or frequency. denies adverse side effects related to current therapy.  GERD - reports compliance with ongoing medical treatment and no changes in medication dose or frequency. denies adverse side effects related to current therapy.   Anemia, chronic - long hx iron defic - prior GI eval includes neg colo 2/05 and egd 4/07 with gastritis (clo neg) - on iron pills and dx b12 defic11/2011 - now monthly onging b12 shots - no abdominal pain, melena or brbpr  Past Medical History  Diagnosis Date  . VITAMIN B12 DEFICIENCY   . ARTHRITIS, GENERALIZED   . SCHATZKI'S RING, HX OF 12/2003    dilation to 15mm on egd  . ANXIETY   . HYPERTENSION   . HYPERLIPIDEMIA   . GERD   . DIABETES MELLITUS, TYPE II   . Nontoxic multinodular goiter   . ANEMIA-NOS     iron def, B12 defic - egd 4/07: gastritis, clo neg; colo 2/05 neg    Review of Systems  Constitutional: Positive for fatigue. Negative for fever.  Respiratory: Negative for cough.   Cardiovascular: Negative for chest pain, palpitations and leg swelling.  Musculoskeletal: Negative for joint swelling and arthralgias.       Objective:   Physical Exam  BP 140/72  Pulse 76  Temp(Src) 98.9 F (37.2 C) (Oral)  Wt  183 lb (83.008 kg)  BMI 30.45 kg/m2  SpO2 97% Wt Readings from Last 3 Encounters:  03/30/13 183 lb (83.008 kg)  03/10/13 189 lb 6.4 oz (85.911 kg)  11/20/12 190 lb (86.183 kg)   Constitutional: She is overweight, but appears well-developed and well-nourished. No distress.  Cardiovascular: Normal rate, regular rhythm and normal heart sounds.  No murmur heard. No BLE edema. Pulmonary/Chest: Effort normal and breath sounds normal. No respiratory distress. She has no wheezes.  Musculoskeletal: senile arthritic changes B hands. Normal range of motion, no joint effusions. No gross deformities. Neurological: She is alert and oriented to person, place, and time. No cranial nerve deficit. Coordination, gait and balance are normal. good normal grip strength bilaterally.  Psychiatric: She has an anxious mood and affect. Her behavior is normal. Judgment and thought content normal.    Lab Results  Component Value Date   WBC 7.2 12/23/2012   HGB 10.8* 12/23/2012   HCT 32.3* 12/23/2012   PLT 276 12/23/2012   CHOL 238* 12/23/2012   TRIG 259* 12/23/2012   HDL 55 12/23/2012   LDLDIRECT 150.2 06/19/2012   ALT 18 12/22/2012   AST 17 12/22/2012   NA 139 12/23/2012   K 4.8 12/23/2012   CL 103 12/23/2012   CREATININE 0.99 12/23/2012   BUN 20 12/23/2012   CO2 27 12/23/2012   TSH 1.725 12/22/2012   INR 0.90 12/22/2012   HGBA1C 8.0* 12/22/2012  Lab Results  Component Value Date   IRON 74 06/19/2011   TIBC 295 06/19/2011        Assessment & Plan:    see problem list. Medications and labs reviewed today.

## 2013-03-30 NOTE — Patient Instructions (Signed)
It was good to see you today. We have reviewed your prior records including labs and tests today Medications reviewed and updated, no changes recommended at this time. Test(s) ordered today. Your results will be released to MyChart (or called to you) after review, usually within 72hours after test completion. If any changes need to be made, you will be notified at that same time. Use sugar free candy or gum to help fight "dry mouth symptoms" Please schedule followup in 4 months, call sooner if problems.

## 2013-04-01 ENCOUNTER — Encounter: Payer: Self-pay | Admitting: Endocrinology

## 2013-04-01 ENCOUNTER — Ambulatory Visit (INDEPENDENT_AMBULATORY_CARE_PROVIDER_SITE_OTHER): Payer: Medicare Other | Admitting: Endocrinology

## 2013-04-01 ENCOUNTER — Ambulatory Visit: Payer: Medicare Other

## 2013-04-01 ENCOUNTER — Telehealth: Payer: Self-pay

## 2013-04-01 ENCOUNTER — Telehealth: Payer: Self-pay | Admitting: Internal Medicine

## 2013-04-01 VITALS — BP 128/70 | HR 79 | Ht 65.0 in | Wt 183.0 lb

## 2013-04-01 DIAGNOSIS — E119 Type 2 diabetes mellitus without complications: Secondary | ICD-10-CM

## 2013-04-01 NOTE — Progress Notes (Signed)
Subjective:    Patient ID: Beverly Vargas, female    DOB: [birth year], 79 y.o.   MRN: [id number]  HPI pt states 3 years h/o type 2 dm.  she is unaware of any chronic complications.  she has never been on insulin.  pt says his diet and exercise are not very good.    She has a few years of slight tingling of the feet, and assoc burning sensation.   She does not check cbg's.   She is extremely hesitant to take insulin.  Pt is unable to cite reason for the deterioration in her control such as weight change or steroids.  Past Medical History  Diagnosis Date  . VITAMIN B12 DEFICIENCY   . ARTHRITIS, GENERALIZED   . SCHATZKI'S RING, HX OF 12/2003    dilation to 15mm on egd  . ANXIETY   . HYPERTENSION   . HYPERLIPIDEMIA   . GERD   . DIABETES MELLITUS, TYPE II   . Nontoxic multinodular goiter   . ANEMIA-NOS     iron def, B12 defic - egd 4/07: gastritis, clo neg; colo 2/05 neg    Past Surgical History  Procedure Laterality Date  . Abdominal hysterectomy      History   Social History  . Marital Status: Divorced    Spouse Name: N/A    Number of Children: N/A  . Years of Education: N/A   Occupational History  . Not on file.   Social History Main Topics  . Smoking status: Never Smoker   . Smokeless tobacco: Not on file     Comment: Divorced, lives alone- dtr lives in town. 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 Alcohol Use: No  . Drug Use: No  . Sexually Active: Not on file   Other Topics Concern  . Not on file   Social History Narrative  . No narrative on file    Current Outpatient Prescriptions on File Prior to Visit  Medication Sig Dispense Refill  . amLODipine (NORVASC) 5 MG tablet Take 1 tablet (5 mg total) by mouth daily.  30 tablet  11  . aspirin 81 MG tablet Take 81 mg by mouth every evening.       . Aspirin-Salicylamide-Caffeine (BC HEADACHE POWDER PO) Take 1 packet by mouth daily as needed (for pain).      Marland Kitchen  atorvastatin (LIPITOR) 40 MG tablet Take 40 mg by mouth every evening.       . cholecalciferol (VITAMIN D) 1000 UNITS tablet Take 1,000 Units by mouth every evening.       . cyanocobalamin (,VITAMIN B-12,) 1000 MCG/ML injection Inject 1,000 mcg into the muscle every 30 (thirty) days.        . Ferrous Fumarate (HIGH POTENCY IRON) 86 (27 FE) MG CAPS Take 1 capsule by mouth every evening.       Marland Kitchen losartan (COZAAR) 50 MG tablet Take 50 mg by mouth every evening.       . meloxicam (MOBIC) 7.5 MG tablet Take 1 tablet (7.5 mg total) by mouth daily as needed for pain.  30 tablet  3  . metFORMIN (GLUCOPHAGE) 1000 MG tablet Take 1 tablet (1,000 mg total) by mouth daily with breakfast.  30 tablet  5  . metoprolol succinate (TOPROL-XL) 25 MG 24 hr tablet Take 1 by mouth daily      . mirabegron ER (MYRBETRIQ) 25 MG TB24 Take 1 tablet (25 mg total) by mouth daily.  30 tablet  3  . Multiple Vitamin (MULTIVITAMIN WITH MINERALS) TABS Take 1 tablet by mouth daily.      Marland Kitchen omeprazole (PRILOSEC OTC) 20 MG tablet Take 20 mg by mouth daily.         No current facility-administered medications on file prior to visit.   No Known Allergies  Family History  Problem Relation Age of Onset  . Arthritis Mother   . Hypertension Mother   . Diabetes Mother   . Hypertension Father   . Diabetes Father   . Arthritis Maternal Grandmother    There were no vitals taken for this visit.  Review of Systems denies blurry vision, headache, chest pain, n/v, urinary frequency, cramps, memory loss, depression, hypoglycemia, rhinorrhea, and easy bruising.  She has lost a few lbs.  She has intermittent doe and excessive diaphoresis.      Objective:   Physical Exam VS: see vs page. GEN: no distress. HEAD: head: no deformity. eyes: no periorbital swelling, no proptosis external nose and ears are normal mouth: no lesion seen NECK: a healed scar is present.  i am uncertain if i can appreciate any thyroid nodules.   CHEST WALL: no  deformity LUNGS:  Clear to auscultation. CV: reg rate and rhythm, no murmur. ABD: abdomen is soft, nontender.  no hepatosplenomegaly.  not distended.  no hernia MUSCULOSKELETAL: muscle bulk and strength are grossly normal.  no obvious joint swelling.  gait is normal and steady EXTEMITIES: no deformity.  no ulcer on the feet.  feet are of normal color and temp.  no edema PULSES: dorsalis pedis intact bilat.  no carotid bruit. NEURO:  cn 2-12 grossly intact.   readily moves all 4's.  sensation is intact to touch on the feet.  SKIN:  Normal texture and temperature.  No rash or suspicious lesion is visible.   NODES:  None palpable at the neck.   PSYCH: alert, oriented x3.  Does not appear anxious nor depressed. Lab Results  Component Value Date   HGBA1C 13.0* 03/30/2013      Assessment & Plan:  DM: she needs increased rx.  i demonstrated lantus pen, and gave pt 20 units.  A simple qd insulin is chosen for her for now, given her extreme reluctance to take insulin.   Weight loss, mild, prob due to severe hyperglycemia. Foot paresthesias, due to DM.

## 2013-04-01 NOTE — Telephone Encounter (Signed)
Pt's cbg was checked in the office, it was 537.

## 2013-04-01 NOTE — Patient Instructions (Addendum)
good diet and exercise habits significanly improve the control of your diabetes.  please let me know if you wish to be referred to a dietician.  high blood sugar is very risky to your health.  you should see an eye doctor every year.  You are at higher than average risk for pneumonia and hepatitis-B.  You should be vaccinated against both.   controlling your blood pressure and cholesterol drastically reduces the damage diabetes does to your body.  this also applies to quitting smoking.  please discuss these with your doctor.  you should take an aspirin every day, unless you have been advised by a doctor not to. check your blood sugar twice a day.  vary the time of day when you check, between before the 3 meals, and at bedtime.  also check if you have symptoms of your blood sugar being too high or too low.  please keep a record of the readings and bring it to your next appointment here.  please call us sooner if your blood sugar goes below 70, or if you have a lot of readings over 200.   Refer to a diabetes education specialist.  you will receive a phone call, about a day and time for an appointment. Please come back tomorrow, when i can give you another insulin shot.

## 2013-04-01 NOTE — Telephone Encounter (Signed)
Rec'd from [facility] forward 2 pages to [employer]

## 2013-04-02 ENCOUNTER — Ambulatory Visit (INDEPENDENT_AMBULATORY_CARE_PROVIDER_SITE_OTHER): Payer: Medicare Other | Admitting: Endocrinology

## 2013-04-02 ENCOUNTER — Ambulatory Visit: Payer: Medicare Other | Admitting: Endocrinology

## 2013-04-02 ENCOUNTER — Ambulatory Visit: Payer: Medicare Other

## 2013-04-02 ENCOUNTER — Encounter: Payer: Self-pay | Admitting: Endocrinology

## 2013-04-02 ENCOUNTER — Telehealth: Payer: Self-pay

## 2013-04-02 VITALS — BP 126/74 | HR 80 | Wt 183.0 lb

## 2013-04-02 DIAGNOSIS — E119 Type 2 diabetes mellitus without complications: Secondary | ICD-10-CM

## 2013-04-02 NOTE — Progress Notes (Signed)
Subjective:    Patient ID: Beverly Vargas, female    DOB: [birth year], 79 y.o.   MRN: [id number]  HPI Pt returns for f/u of insulin-requiring DM (dx'ed 2011; she has mild sensory neuropathy of the lower extremities, but no associated complications).  Since she got 20 units of lantus yesterday, she feels slightly better, but she still has dry mouth.   Past Medical History  Diagnosis Date  . VITAMIN B12 DEFICIENCY   . ARTHRITIS, GENERALIZED   . SCHATZKI'S RING, HX OF 12/2003    dilation to 15mm on egd  . ANXIETY   . HYPERTENSION   . HYPERLIPIDEMIA   . GERD   . DIABETES MELLITUS, TYPE II   . Nontoxic multinodular goiter   . ANEMIA-NOS     iron def, B12 defic - egd 4/07: gastritis, clo neg; colo 2/05 neg    Past Surgical History  Procedure Laterality Date  . Abdominal hysterectomy      History   Social History  . Marital Status: Divorced    Spouse Name: N/A    Number of Children: N/A  . Years of Education: N/A   Occupational History  . Not on file.   Social History Main Topics  . Smoking status: Never Smoker   . Smokeless tobacco: Not on file     Comment: Divorced, lives alone- dtr lives in town. 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 Alcohol Use: No  . Drug Use: No  . Sexually Active: Not on file   Other Topics Concern  . Not on file   Social History Narrative  . No narrative on file    Current Outpatient Prescriptions on File Prior to Visit  Medication Sig Dispense Refill  . amLODipine (NORVASC) 5 MG tablet Take 1 tablet (5 mg total) by mouth daily.  30 tablet  11  . aspirin 81 MG tablet Take 81 mg by mouth every evening.       . Aspirin-Salicylamide-Caffeine (BC HEADACHE POWDER PO) Take 1 packet by mouth daily as needed (for pain).      Marland Kitchen atorvastatin (LIPITOR) 40 MG tablet Take 40 mg by mouth every evening.       . cholecalciferol (VITAMIN D) 1000 UNITS tablet Take 1,000 Units by mouth every evening.       .  cyanocobalamin (,VITAMIN B-12,) 1000 MCG/ML injection Inject 1,000 mcg into the muscle every 30 (thirty) days.        . Ferrous Fumarate (HIGH POTENCY IRON) 86 (27 FE) MG CAPS Take 1 capsule by mouth every evening.       Marland Kitchen losartan (COZAAR) 50 MG tablet Take 50 mg by mouth every evening.       . meloxicam (MOBIC) 7.5 MG tablet Take 1 tablet (7.5 mg total) by mouth daily as needed for pain.  30 tablet  3  . metFORMIN (GLUCOPHAGE) 1000 MG tablet Take 1 tablet (1,000 mg total) by mouth daily with breakfast.  30 tablet  5  . metoprolol succinate (TOPROL-XL) 25 MG 24 hr tablet Take 1 by mouth daily      . mirabegron ER (MYRBETRIQ) 25 MG TB24 Take 1 tablet (25 mg total) by mouth daily.  30 tablet  3  . Multiple Vitamin (MULTIVITAMIN WITH MINERALS) TABS Take 1 tablet by mouth daily.      Marland Kitchen omeprazole (PRILOSEC OTC) 20 MG tablet Take 20 mg by mouth daily.         No  current facility-administered medications on file prior to visit.    No Known Allergies  Family History  Problem Relation Age of Onset  . Arthritis Mother   . Hypertension Mother   . Diabetes Mother   . Hypertension Father   . Diabetes Father   . Arthritis Maternal Grandmother     BP 126/74  Pulse 80  Wt 183 lb (83.008 kg)  BMI 30.45 kg/m2  SpO2 98%   Review of Systems Denies n/v    Objective:   Physical Exam VITAL SIGNS:  See vs page GENERAL: no distress SKIN:  Insulin injection site at the anterior abdomen is normal.        Assessment & Plan:  DM: only slight improvement in control.  i reviewed lantus pen with patient again.  She gave herself 30 units.  Because she needs a simple insulin schedule, lantus is the best option for now.

## 2013-04-02 NOTE — Patient Instructions (Signed)
Please continue lantus, 30 units daily. Here are some samples. Please come back for a follow-up appointment in 3 days.

## 2013-04-02 NOTE — Telephone Encounter (Signed)
Pt's cbg today is [phone]

## 2013-04-06 ENCOUNTER — Encounter: Payer: Self-pay | Admitting: Internal Medicine

## 2013-04-06 ENCOUNTER — Encounter: Payer: Self-pay | Admitting: Endocrinology

## 2013-04-06 ENCOUNTER — Ambulatory Visit (INDEPENDENT_AMBULATORY_CARE_PROVIDER_SITE_OTHER): Payer: Medicare Other | Admitting: Endocrinology

## 2013-04-06 VITALS — BP 128/70 | HR 75 | Wt 185.0 lb

## 2013-04-06 DIAGNOSIS — E119 Type 2 diabetes mellitus without complications: Secondary | ICD-10-CM

## 2013-04-06 MED ORDER — INSULIN GLARGINE 100 UNIT/ML SOLOSTAR PEN: 40.0000 [IU] | PEN_INJECTOR | Freq: Every day | SUBCUTANEOUS | Status: DC

## 2013-04-06 MED ORDER — GLUCOSE BLOOD VI STRP: 1.0000 | ORAL_STRIP | Freq: Two times a day (BID) | Status: DC

## 2013-04-06 NOTE — Patient Instructions (Addendum)
Please increase lantus to 40 units daily.   Please come back for a follow-up appointment in 3 days.   check your blood sugar twice a day.  vary the time of day when you check, between before the 3 meals, and at bedtime.  also check if you have symptoms of your blood sugar being too high or too low.  please keep a record of the readings and bring it to your next appointment here.  please call us sooner if your blood sugar goes below 70, or if you have a lot of readings over 200.   Here is a new blood-sugar meter.  i have sent a prescription to your pharmacy, for strips.

## 2013-04-06 NOTE — Progress Notes (Signed)
Subjective:    Patient ID: Beverly Vargas, female    DOB: 09-Jun-1927, 79 y.o.   MRN: [id number]  HPI Pt returns for f/u of insulin-requiring DM (dx'ed 2011; she has mild sensory neuropathy of the lower extremities, but no associated complications; in may of 2014, she presented with worsening glycemic control, and was started on insulin).  She feels better now.  In particular, dry mouth is better.     Past Medical History  Diagnosis Date  . VITAMIN B12 DEFICIENCY   . ARTHRITIS, GENERALIZED   . SCHATZKI'S RING, HX OF 12/2003    dilation to 15mm on egd  . ANXIETY   . HYPERTENSION   . HYPERLIPIDEMIA   . GERD   . DIABETES MELLITUS, TYPE II   . Nontoxic multinodular goiter   . ANEMIA-NOS     iron def, B12 defic - egd 4/07: gastritis, clo neg; colo 2/05 neg    Past Surgical History  Procedure Laterality Date  . Abdominal hysterectomy      History   Social History  . Marital Status: Divorced    Spouse Name: N/A    Number of Children: N/A  . Years of Education: N/A   Occupational History  . Not on file.   Social History Main Topics  . Smoking status: Never Smoker   . Smokeless tobacco: Not on file     Comment: Divorced, lives alone- dtr lives in town. 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 Alcohol Use: No  . Drug Use: No  . Sexually Active: Not on file   Other Topics Concern  . Not on file   Social History Narrative  . No narrative on file    Current Outpatient Prescriptions on File Prior to Visit  Medication Sig Dispense Refill  . amLODipine (NORVASC) 5 MG tablet Take 1 tablet (5 mg total) by mouth daily.  30 tablet  11  . aspirin 81 MG tablet Take 81 mg by mouth every evening.       . Aspirin-Salicylamide-Caffeine (BC HEADACHE POWDER PO) Take 1 packet by mouth daily as needed (for pain).      Marland Kitchen atorvastatin (LIPITOR) 40 MG tablet Take 40 mg by mouth every evening.       . cholecalciferol (VITAMIN D) 1000 UNITS  tablet Take 1,000 Units by mouth every evening.       . cyanocobalamin (,VITAMIN B-12,) 1000 MCG/ML injection Inject 1,000 mcg into the muscle every 30 (thirty) days.        . Ferrous Fumarate (HIGH POTENCY IRON) 86 (27 FE) MG CAPS Take 1 capsule by mouth every evening.       Marland Kitchen losartan (COZAAR) 50 MG tablet Take 50 mg by mouth every evening.       . meloxicam (MOBIC) 7.5 MG tablet Take 1 tablet (7.5 mg total) by mouth daily as needed for pain.  30 tablet  3  . metFORMIN (GLUCOPHAGE) 1000 MG tablet Take 1 tablet (1,000 mg total) by mouth daily with breakfast.  30 tablet  5  . metoprolol succinate (TOPROL-XL) 25 MG 24 hr tablet Take 1 by mouth daily      . mirabegron ER (MYRBETRIQ) 25 MG TB24 Take 1 tablet (25 mg total) by mouth daily.  30 tablet  3  . Multiple Vitamin (MULTIVITAMIN WITH MINERALS) TABS Take 1 tablet by mouth daily.      Marland Kitchen omeprazole (PRILOSEC OTC) 20 MG tablet Take 20 mg by mouth  daily.         No current facility-administered medications on file prior to visit.   No Known Allergies  Family History  Problem Relation Age of Onset  . Arthritis Mother   . Hypertension Mother   . Diabetes Mother   . Hypertension Father   . Diabetes Father   . Arthritis Maternal Grandmother    BP 128/70  Pulse 75  Wt 185 lb (83.915 kg)  BMI 30.79 kg/m2  SpO2 98%  Review of Systems denies hypoglycemia and n/v.      Objective:   Physical Exam VITAL SIGNS:  See vs page GENERAL: no distress. SKIN:  Insulin injection sites at the anterior abdomen are normal   CBG=288    Assessment & Plan:  DM: control is improved.  This regimen is chosen for her from among the available options, due to its simplicity.  She may be able to handle a more complex regimen later.

## 2013-04-09 ENCOUNTER — Ambulatory Visit (INDEPENDENT_AMBULATORY_CARE_PROVIDER_SITE_OTHER): Payer: Medicare Other | Admitting: Endocrinology

## 2013-04-09 ENCOUNTER — Telehealth: Payer: Self-pay

## 2013-04-09 ENCOUNTER — Encounter: Payer: Self-pay | Admitting: Endocrinology

## 2013-04-09 VITALS — BP 136/70 | HR 77 | Ht 67.0 in | Wt 188.0 lb

## 2013-04-09 DIAGNOSIS — E1049 Type 1 diabetes mellitus with other diabetic neurological complication: Secondary | ICD-10-CM | POA: Insufficient documentation

## 2013-04-09 DIAGNOSIS — E119 Type 2 diabetes mellitus without complications: Secondary | ICD-10-CM

## 2013-04-09 NOTE — Progress Notes (Signed)
Subjective:    Patient ID: Beverly Vargas, female    DOB: 1927/03/19, 79 y.o.   MRN: [id number]  HPI Pt returns for f/u of insulin-requiring DM (dx'ed 2011; she has mild sensory neuropathy of the lower extremities, but no other associated complications; in may of 2014, she presented with worsening glycemic control, and was started on insulin).  She feels much better now.  In particular, dry mouth is better.  She has not yet started using her cbg meter.  She has regained a few lbs.   Past Medical History  Diagnosis Date  . VITAMIN B12 DEFICIENCY   . ARTHRITIS, GENERALIZED   . SCHATZKI'S RING, HX OF 12/2003    dilation to 15mm on egd  . ANXIETY   . HYPERTENSION   . HYPERLIPIDEMIA   . GERD   . DIABETES MELLITUS, TYPE II   . Nontoxic multinodular goiter   . ANEMIA-NOS     iron def, B12 defic - egd 4/07: gastritis, clo neg; colo 2/05 neg    Past Surgical History  Procedure Laterality Date  . Abdominal hysterectomy      History   Social History  . Marital Status: Divorced    Spouse Name: N/A    Number of Children: N/A  . Years of Education: N/A   Occupational History  . Not on file.   Social History Main Topics  . Smoking status: Never Smoker   . Smokeless tobacco: Not on file     Comment: Divorced, lives alone- dtr lives in town. 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 Alcohol Use: No  . Drug Use: No  . Sexually Active: Not on file   Other Topics Concern  . Not on file   Social History Narrative  . No narrative on file    Current Outpatient Prescriptions on File Prior to Visit  Medication Sig Dispense Refill  . amLODipine (NORVASC) 5 MG tablet Take 1 tablet (5 mg total) by mouth daily.  30 tablet  11  . aspirin 81 MG tablet Take 81 mg by mouth every evening.       . Aspirin-Salicylamide-Caffeine (BC HEADACHE POWDER PO) Take 1 packet by mouth daily as needed (for pain).      Marland Kitchen atorvastatin (LIPITOR) 40 MG tablet  Take 40 mg by mouth every evening.       . cholecalciferol (VITAMIN D) 1000 UNITS tablet Take 1,000 Units by mouth every evening.       . cyanocobalamin (,VITAMIN B-12,) 1000 MCG/ML injection Inject 1,000 mcg into the muscle every 30 (thirty) days.        . Ferrous Fumarate (HIGH POTENCY IRON) 86 (27 FE) MG CAPS Take 1 capsule by mouth every evening.       Marland Kitchen glucose blood (ONE TOUCH ULTRA TEST) test strip 1 each by Other route 2 (two) times daily. And lancets 2/day 250.01  100 each  12  . losartan (COZAAR) 50 MG tablet Take 50 mg by mouth every evening.       . meloxicam (MOBIC) 7.5 MG tablet Take 1 tablet (7.5 mg total) by mouth daily as needed for pain.  30 tablet  3  . metFORMIN (GLUCOPHAGE) 1000 MG tablet Take 1 tablet (1,000 mg total) by mouth daily with breakfast.  30 tablet  5  . metoprolol succinate (TOPROL-XL) 25 MG 24 hr tablet Take 1 by mouth daily      . mirabegron ER (MYRBETRIQ) 25 MG TB24  Take 1 tablet (25 mg total) by mouth daily.  30 tablet  3  . Multiple Vitamin (MULTIVITAMIN WITH MINERALS) TABS Take 1 tablet by mouth daily.      Marland Kitchen omeprazole (PRILOSEC OTC) 20 MG tablet Take 20 mg by mouth daily.         No current facility-administered medications on file prior to visit.   No Known Allergies  Family History  Problem Relation Age of Onset  . Arthritis Mother   . Hypertension Mother   . Diabetes Mother   . Hypertension Father   . Diabetes Father   . Arthritis Maternal Grandmother    BP 136/70  Pulse 77  Ht 5\' 7"  (1.702 m)  Wt 188 lb (85.276 kg)  BMI 29.44 kg/m2  SpO2 98%  Review of Systems denies hypoglycemia    Objective:   Physical Exam VITAL SIGNS:  See vs page GENERAL: no distress Gait: normal and steady.     Assessment & Plan:  DM: she needs increased rx.  This insulin regimen was chosen from multiple options, as simplicity is needed now, given her reluctance to take insulin.  In the future, she may be a candidate for multiple daily injections.  The  benefits of glycemic control must be weighed against the risks of hypoglycemia.

## 2013-04-09 NOTE — Patient Instructions (Addendum)
Please increase lantus to 60 units daily.   Please come back for a follow-up appointment in 5-7 days.   check your blood sugar twice a day.  vary the time of day when you check, between before the 3 meals, and at bedtime.  also check if you have symptoms of your blood sugar being too high or too low.  please keep a record of the readings and bring it to your next appointment here.  please call us sooner if your blood sugar goes below 70, or if you have a lot of readings over 200.

## 2013-04-09 NOTE — Telephone Encounter (Signed)
Pt's cbg today at 3pm is 499

## 2013-04-16 ENCOUNTER — Other Ambulatory Visit: Payer: Self-pay

## 2013-04-16 MED ORDER — GLUCOSE BLOOD VI STRP: 1.0000 | ORAL_STRIP | Freq: Two times a day (BID) | Status: AC

## 2013-04-19 ENCOUNTER — Ambulatory Visit (INDEPENDENT_AMBULATORY_CARE_PROVIDER_SITE_OTHER): Payer: Medicare Other | Admitting: Endocrinology

## 2013-04-19 ENCOUNTER — Encounter: Payer: Self-pay | Admitting: Endocrinology

## 2013-04-19 ENCOUNTER — Ambulatory Visit: Payer: Medicare Other | Admitting: Endocrinology

## 2013-04-19 VITALS — BP 128/78 | HR 75 | Wt 190.0 lb

## 2013-04-19 DIAGNOSIS — E119 Type 2 diabetes mellitus without complications: Secondary | ICD-10-CM

## 2013-04-19 MED ORDER — INSULIN PEN NEEDLE 29G X 12MM MISC: 1.0000 | Freq: Every day | Status: AC

## 2013-04-19 NOTE — Patient Instructions (Addendum)
Please reduce lantus back to 20 units daily.   Please come back for a follow-up appointment in 2 weeks. check your blood sugar twice a day.  vary the time of day when you check, between before the 3 meals, and at bedtime.  also check if you have symptoms of your blood sugar being too high or too low.  please keep a record of the readings and bring it to your next appointment here.  please call us sooner if your blood sugar goes below 70, or if you have a lot of readings over 200.   Here are some more insulin sample pens.  i have sent a prescription to your pharmacy, for more needles.

## 2013-04-19 NOTE — Progress Notes (Signed)
Subjective:    Patient ID: Beverly Vargas, female    DOB: 10-31-1927, 79 y.o.   MRN: [id number]  HPI Pt returns for f/u of insulin-requiring DM (dx'ed 2011; she has mild sensory neuropathy of the lower extremities, but no other associated complications; in may of 2014, she presented with worsening glycemic control, and was started on insulin).  She ran out of insulin, after she took her last 20 units yesterday. cbg's vary from 60-200, on the full 60 units qd.  It is in general higher as the day goes on.  pt states she feels well in general, except for intermittent diaphoresis, when she has mild hypoglycemia.   Past Medical History  Diagnosis Date  . VITAMIN B12 DEFICIENCY   . ARTHRITIS, GENERALIZED   . SCHATZKI'S RING, HX OF 12/2003    dilation to 15mm on egd  . ANXIETY   . HYPERTENSION   . HYPERLIPIDEMIA   . GERD   . DIABETES MELLITUS, TYPE II   . Nontoxic multinodular goiter   . ANEMIA-NOS     iron def, B12 defic - egd 4/07: gastritis, clo neg; colo 2/05 neg    Past Surgical History  Procedure Laterality Date  . Abdominal hysterectomy      History   Social History  . Marital Status: Divorced    Spouse Name: N/A    Number of Children: N/A  . Years of Education: N/A   Occupational History  . Not on file.   Social History Main Topics  . Smoking status: Never Smoker   . Smokeless tobacco: Not on file     Comment: Divorced, lives alone- dtr lives in town. 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 Alcohol Use: No  . Drug Use: No  . Sexually Active: Not on file   Other Topics Concern  . Not on file   Social History Narrative  . No narrative on file    Current Outpatient Prescriptions on File Prior to Visit  Medication Sig Dispense Refill  . amLODipine (NORVASC) 5 MG tablet Take 1 tablet (5 mg total) by mouth daily.  30 tablet  11  . aspirin 81 MG tablet Take 81 mg by mouth every evening.       .  Aspirin-Salicylamide-Caffeine (BC HEADACHE POWDER PO) Take 1 packet by mouth daily as needed (for pain).      Marland Kitchen atorvastatin (LIPITOR) 40 MG tablet Take 40 mg by mouth every evening.       . cholecalciferol (VITAMIN D) 1000 UNITS tablet Take 1,000 Units by mouth every evening.       . cyanocobalamin (,VITAMIN B-12,) 1000 MCG/ML injection Inject 1,000 mcg into the muscle every 30 (thirty) days.        . Ferrous Fumarate (HIGH POTENCY IRON) 86 (27 FE) MG CAPS Take 1 capsule by mouth every evening.       Marland Kitchen glucose blood (ONE TOUCH ULTRA TEST) test strip 1 each by Other route 2 (two) times daily. And lancets 2/day 250.01  100 each  12  . Insulin Glargine 100 UNIT/ML SOPN Inject 20 Units into the skin daily. And pen needles 1/day      . losartan (COZAAR) 50 MG tablet Take 50 mg by mouth every evening.       . meloxicam (MOBIC) 7.5 MG tablet Take 1 tablet (7.5 mg total) by mouth daily as needed for pain.  30 tablet  3  . metFORMIN (GLUCOPHAGE) 1000 MG  tablet Take 1 tablet (1,000 mg total) by mouth daily with breakfast.  30 tablet  5  . metoprolol succinate (TOPROL-XL) 25 MG 24 hr tablet Take 1 by mouth daily      . mirabegron ER (MYRBETRIQ) 25 MG TB24 Take 1 tablet (25 mg total) by mouth daily.  30 tablet  3  . Multiple Vitamin (MULTIVITAMIN WITH MINERALS) TABS Take 1 tablet by mouth daily.      Marland Kitchen omeprazole (PRILOSEC OTC) 20 MG tablet Take 20 mg by mouth daily.         No current facility-administered medications on file prior to visit.    No Known Allergies  Family History  Problem Relation Age of Onset  . Arthritis Mother   . Hypertension Mother   . Diabetes Mother   . Hypertension Father   . Diabetes Father   . Arthritis Maternal Grandmother     BP 128/78  Pulse 75  Wt 190 lb (86.183 kg)  BMI 29.75 kg/m2  SpO2 96%   Review of Systems denies hypoglycemia and weight change    Objective:   Physical Exam VITAL SIGNS:  See vs page GENERAL: no distress  she brings a record of her  cbg's which i have reviewed today.    Assessment & Plan:  DM: This insulin regimen was chosen from multiple options, for its simplicity.  The benefits of glycemic control must be weighed against the risks of hypoglycemia.  The reason for her falling insulin requirement is unclear.

## 2013-05-03 ENCOUNTER — Ambulatory Visit: Payer: Medicare Other | Admitting: Endocrinology

## 2013-05-03 DIAGNOSIS — Z0289 Encounter for other administrative examinations: Secondary | ICD-10-CM

## 2013-05-05 ENCOUNTER — Ambulatory Visit (INDEPENDENT_AMBULATORY_CARE_PROVIDER_SITE_OTHER): Payer: Medicare Other | Admitting: Internal Medicine

## 2013-05-05 ENCOUNTER — Ambulatory Visit: Payer: Medicare Other

## 2013-05-05 ENCOUNTER — Encounter: Payer: Self-pay | Admitting: Internal Medicine

## 2013-05-05 VITALS — BP 142/62 | HR 68 | Temp 98.0°F | Wt 191.0 lb

## 2013-05-05 DIAGNOSIS — I10 Essential (primary) hypertension: Secondary | ICD-10-CM

## 2013-05-05 DIAGNOSIS — E1129 Type 2 diabetes mellitus with other diabetic kidney complication: Secondary | ICD-10-CM

## 2013-05-05 DIAGNOSIS — E785 Hyperlipidemia, unspecified: Secondary | ICD-10-CM

## 2013-05-05 DIAGNOSIS — E1165 Type 2 diabetes mellitus with hyperglycemia: Secondary | ICD-10-CM

## 2013-05-05 DIAGNOSIS — E538 Deficiency of other specified B group vitamins: Secondary | ICD-10-CM

## 2013-05-05 MED ORDER — CYANOCOBALAMIN 1000 MCG/ML IJ SOLN
1000.0000 ug | Freq: Once | INTRAMUSCULAR | Status: AC
  Administered 2013-05-05: 1000 ug via INTRAMUSCULAR

## 2013-05-05 NOTE — Assessment & Plan Note (Signed)
On statin, changed from simva to atorva by cards (harwani) The current medical regimen is effective;  continue present plan and medications.  Check lipids annually

## 2013-05-05 NOTE — Progress Notes (Signed)
Subjective:    Patient ID: Beverly Vargas, female    DOB: December 08, 1926, 79 y.o.   MRN: [id number]  HPI  Here for follow up - reviewed chronic med issues  DM2 - working with endo -reports variable compliance with ongoing medical treatment and no changes in medication dose or frequency. denies adverse side effects related to current therapy. checks sugars 2x/d, no symptoms or signs hypoglycemia  HTN - also following with with cards. denies adverse side effects related to current therapy. no chest pain, headache or change in edema  dyslipidema - prev on simva, changed to atorva by cards summer 2012; reports variable compliance with ongoing medical treatment and no changes in medication dose or frequency. denies adverse side effects related to current therapy.  GERD - reports compliance with ongoing medical treatment and no changes in medication dose or frequency. denies adverse side effects related to current therapy.   Anemia, chronic - long hx iron defic - prior GI eval includes neg colo 2/05 and egd 4/07 with gastritis (clo neg) - on iron pills and dx b12 defic11/2011 - now monthly onging b12 shots - no abdominal pain, melena or brbpr  Past Medical History  Diagnosis Date  . VITAMIN B12 DEFICIENCY   . ARTHRITIS, GENERALIZED   . SCHATZKI'S RING, HX OF 12/2003    dilation to 15mm on egd  . ANXIETY   . HYPERTENSION   . HYPERLIPIDEMIA   . GERD   . DIABETES MELLITUS, TYPE II   . Nontoxic multinodular goiter   . ANEMIA-NOS     iron def, B12 defic - egd 4/07: gastritis, clo neg; colo 2/05 neg    Review of Systems  Constitutional: Positive for fatigue. Negative for fever.  Respiratory: Negative for cough.   Cardiovascular: Negative for chest pain, palpitations and leg swelling.  Musculoskeletal: Negative for joint swelling and arthralgias.       Objective:   Physical Exam  BP 142/62  Pulse 68  Temp(Src) 98 F (36.7 C) (Oral)  Wt 191 lb (86.637 kg)  BMI 29.91 kg/m2  SpO2  95% Wt Readings from Last 3 Encounters:  05/05/13 191 lb (86.637 kg)  04/19/13 190 lb (86.183 kg)  04/09/13 188 lb (85.276 kg)   Constitutional: She is overweight, but appears well-developed and well-nourished. No distress.  Cardiovascular: Normal rate, regular rhythm and normal heart sounds.  No murmur heard. No BLE edema. Pulmonary/Chest: Effort normal and breath sounds normal. No respiratory distress. She has no wheezes.  Musculoskeletal: senile arthritic changes B hands. Normal range of motion, no joint effusions. No gross deformities. Psychiatric: She has an anxious mood and affect. Her behavior is normal. Judgment and thought content normal.    Lab Results  Component Value Date   WBC 7.4 03/30/2013   HGB 11.9* 03/30/2013   HCT 35.1* 03/30/2013   PLT 272.0 03/30/2013   CHOL 238* 12/23/2012   TRIG 259* 12/23/2012   HDL 55 12/23/2012   LDLDIRECT 150.2 06/19/2012   ALT 18 12/22/2012   AST 17 12/22/2012   NA 139 12/23/2012   K 4.8 12/23/2012   CL 103 12/23/2012   CREATININE 0.99 12/23/2012   BUN 20 12/23/2012   CO2 27 12/23/2012   TSH 1.725 12/22/2012   INR 0.90 12/22/2012   HGBA1C 13.0* 03/30/2013   MICROALBUR 2.2* 03/30/2013   Lab Results  Component Value Date   IRON 74 06/19/2011   TIBC 295 06/19/2011   FERRITIN 142.3 03/30/2013       Assessment &  Plan:    see problem list. Medications and labs reviewed today.  Time spent with pt today 25 minutes, greater than 50% time spent counseling patient on diabetes and medication review. Also review of prior records

## 2013-05-05 NOTE — Patient Instructions (Signed)
It was good to see you today. We have reviewed your prior records including labs and tests today Medications reviewed and updated, no changes recommended at this time. Please schedule followup in 4 months, call sooner if problems.

## 2013-05-05 NOTE — Assessment & Plan Note (Signed)
Working with endo on same - reports improving control There have been some probable medication, dietary and lifestyle compliance issues here. I have discussed with her the great importance of following the treatment plan exactly as directed in order to achieve a good medical outcome. Check a1c q3-62mo and adjust as needed  Lab Results  Component Value Date   HGBA1C 13.0* 03/30/2013

## 2013-05-05 NOTE — Assessment & Plan Note (Signed)
The current medical regimen is reasonably effective;  encouraged ARB compliance given DM.  increased amlodipine dose 03/2013   BP Readings from Last 3 Encounters:  05/05/13 142/62  04/19/13 128/78  04/09/13 136/70

## 2013-05-07 ENCOUNTER — Telehealth: Payer: Self-pay

## 2013-05-07 MED ORDER — INSULIN GLARGINE 100 UNIT/ML SOLOSTAR PEN: 20.0000 [IU] | PEN_INJECTOR | Freq: Every day | SUBCUTANEOUS | Status: AC

## 2013-05-07 NOTE — Telephone Encounter (Signed)
Please schedule

## 2013-05-07 NOTE — Telephone Encounter (Signed)
Pt states she never got rx for insulin pens she was given samples in the office but she needs rx [facility] @ [address]

## 2013-05-07 NOTE — Telephone Encounter (Signed)
i refilled x 1 Ov is due 

## 2013-05-12 ENCOUNTER — Encounter: Payer: Medicare Other | Attending: Endocrinology | Admitting: *Deleted

## 2013-05-12 ENCOUNTER — Encounter: Payer: Self-pay | Admitting: *Deleted

## 2013-05-12 VITALS — Ht 65.5 in | Wt 192.9 lb

## 2013-05-12 DIAGNOSIS — Z713 Dietary counseling and surveillance: Secondary | ICD-10-CM | POA: Insufficient documentation

## 2013-05-12 DIAGNOSIS — E119 Type 2 diabetes mellitus without complications: Secondary | ICD-10-CM | POA: Insufficient documentation

## 2013-05-12 NOTE — Progress Notes (Signed)
Medical Nutrition Therapy:  Appt start time: 1030 end time:  1200.  Assessment:  Primary concerns today: patient here for diabetes education including insulin and BGM instruction. She states she is checking her BG every day with One Touch Ultra 2 meter, but is unsure how to take lancet out of lancing device. She SMBG at alternate times of day as directed by MD. She states she is giving her Lantus insulin in her stomach alternating location from one side to the other. She states she is taking 30 units once a day at different times of day also stateing she does not know what time she is supposed to take it. She lives alone, so she does her own shopping and food preparation. She used to walk at gym but hasn't felt like it lately.  MEDICATIONS: see list. Diabetes medications include Metformin and Lantus   DIETARY INTAKE:  Usual eating pattern includes 3 meals and 0-1 snacks per day.  Everyday foods include good variety of all food groups except fruits occasionally.  Avoided foods include rice, higher fat meats, fried foods.    24-hr recall:  B ( AM): at various times of AM, boiled egg, 2 bacon or beef sausage, 1 plain roll, coffee with Splenda and CoffeeMate Snk ( AM): none  L ( PM): skips due to late breakfast Snk ( PM): none D ( PM): she cooks left overs with meat, starch, occasionally a vegetable, water or 12 oz regular soda Snk ( PM): rarely maybe chips OR Sugar Free candy OR pkg of Lance crackers Beverages: coffee, water, regular soda  Usual physical activity: used to walk but not lately  Estimated energy needs: 1200 calories 135 g carbohydrates 90 g protein 33 g fat  Progress Towards Goal(s):  In progress.   Nutritional Diagnosis:  NB-1.1 Food and nutrition-related knowledge deficit As related to diabetes management.  As evidenced by A1c of 13.0 % in May, 2014.    Intervention:  Nutrition counseling and diabetes education initiated. Discussed basic physiology of diabetes, SMBG  and rationale of checking BG at alternate times of day, A1c, Carb Counting and reading food labels, and benefits of increased activity. Also discussed insulin action of Lantus and encouraged her to take it at a consistent time each day. We agreed upon 8 PM after her supper and before she may fall asleep watching tv in the evening. Also discussed symptoms and causes of hypoglycemia, 15 Rule for treatment and rationale of carrying carb containing food with her. Worked with patient on use of her meter and demonstrated how to expel the lancet properly. Also reviewed rotation of sites for insulin administration. Plan:  Aim for 2 Carb Choices per meal (30 grams) +/- 1 either way  Continue with current breakfast and dinner meal but consider adding a meal/snack at 9-10 PM of 2 Carb Choices with protein such as PNB Consider  increasing your activity level by walking for 15-30 minutes daily as tolerated Continue checking BG at alternate times per day as directed by MD  Consider taking medication (Lantus) at consistent time of day such as 8 PM right after supper meal as directed by MD Consider carrying regular mint candies (not sugar free) in your purse in case your sugar drops when you're not at home.   Handouts given during visit include: Living Well with Diabetes Carb Counting handouts Meal Plan Card  Insulin Action handout  Monitoring/Evaluation:  Dietary intake, exercise, take Lantus at consistent time of day, and body weight in 6 week(s).

## 2013-05-12 NOTE — Patient Instructions (Signed)
Plan:  Aim for 2 Carb Choices per meal (30 grams) +/- 1 either way  Continue with current breakfast and dinner meal but consider adding a meal/snack at 9-10 PM of 2 Carb Choices with protein such as PNB Consider  increasing your activity level by walking for 15-30 minutes daily as tolerated Continue checking BG at alternate times per day as directed by MD  Consider taking medication (Lantus) at consistent time of day such as 8 PM right after supper meal as directed by MD Consider carrying regular mint candies (not sugar free) in your purse in case your sugar drops when you're not at home.

## 2013-05-18 NOTE — Telephone Encounter (Signed)
Called pt to schedule an appt but there was no answer.Beverly Vargas

## 2013-06-01 ENCOUNTER — Ambulatory Visit (INDEPENDENT_AMBULATORY_CARE_PROVIDER_SITE_OTHER): Payer: Medicare Other | Admitting: *Deleted

## 2013-06-01 DIAGNOSIS — E538 Deficiency of other specified B group vitamins: Secondary | ICD-10-CM

## 2013-06-01 MED ORDER — CYANOCOBALAMIN 1000 MCG/ML IJ SOLN
1000.0000 ug | Freq: Once | INTRAMUSCULAR | Status: AC
  Administered 2013-06-01: 1000 ug via INTRAMUSCULAR

## 2013-06-03 ENCOUNTER — Ambulatory Visit: Payer: Medicare Other

## 2013-06-16 ENCOUNTER — Telehealth: Payer: Self-pay | Admitting: *Deleted

## 2013-06-16 MED ORDER — FUROSEMIDE 20 MG PO TABS: 20.0000 mg | ORAL_TABLET | Freq: Every day | ORAL | Status: DC

## 2013-06-16 NOTE — Telephone Encounter (Signed)
Pt called states she is having bilateral leg and foot swelling.  She is requesting something for this.  Please advise

## 2013-06-16 NOTE — Telephone Encounter (Signed)
Furosemide 20 mg once daily for 5 days, then as needed for swelling - New electronic prescription done

## 2013-06-17 NOTE — Telephone Encounter (Signed)
Pt advised. Rx ready

## 2013-06-23 ENCOUNTER — Telehealth: Payer: Self-pay | Admitting: Endocrinology

## 2013-06-23 ENCOUNTER — Encounter: Payer: Medicare Other | Attending: Endocrinology | Admitting: *Deleted

## 2013-06-23 DIAGNOSIS — Z713 Dietary counseling and surveillance: Secondary | ICD-10-CM | POA: Insufficient documentation

## 2013-06-23 DIAGNOSIS — E119 Type 2 diabetes mellitus without complications: Secondary | ICD-10-CM | POA: Insufficient documentation

## 2013-06-23 NOTE — Progress Notes (Signed)
  Medical Nutrition Therapy:  Appt start time: 1045 end time:  1115.  Assessment:  Primary concerns today: patient here for follow up diabetes education including insulin and BGM instruction. States she is taking her Lantus more consistently now between 9 and 10 PM each night. She continues checking her BG daily, not sure if they are any better yet. She has added the evening snack routinely now as recommended. Like sot go to the [facility] but it is about 20 miles from her house.  MEDICATIONS: see list. Diabetes medications include Metformin and Lantus   DIETARY INTAKE:  Usual eating pattern includes 3 meals and 0-1 snacks per day.  Everyday foods include good variety of all food groups except fruits occasionally.  Avoided foods include rice, higher fat meats, fried foods.    24-hr recall:  B ( AM): at various times of AM, boiled egg, 2 bacon or beef sausage, 1 plain roll, coffee with Splenda and CoffeeMate Snk ( AM): none  L ( PM): skips due to late breakfast Snk ( PM): none D ( PM): she cooks left overs with meat, starch, occasionally a vegetable, water or 12 oz regular soda Snk ( PM): rarely maybe chips OR Sugar Free candy OR pkg of Lance crackers Beverages: coffee, water, regular soda  Usual physical activity: used to walk but not lately  Estimated energy needs: 1200 calories 135 g carbohydrates 90 g protein 33 g fat  Progress Towards Goal(s):  In progress.   Nutritional Diagnosis:  NB-1.1 Food and nutrition-related knowledge deficit As related to diabetes management.  As evidenced by A1c of 13.0 % in May, 2014.    Intervention: Reviewed her progress, encouraged her to continue taking her Lantus at consistent time in the evening, including evening snack to prevent low BG during the night, and to continue with consistent carb meals.   Plan:  Continue to aim for 2 Carb Choices per meal (30 grams) +/- 1 either way  Continue with current breakfast and dinner meal but consider adding a  meal/snack at 9-10 PM of 2 Carb Choices with protein such as PNB Consider  increasing your activity level by walking for 15 minutes daily as tolerated Continue checking BG at alternate times per day as directed by MD  Continue taking medication (Lantus) at consistent time of day such as 8 PM right after supper meal as directed by MD Continue carrying regular mint candies (not sugar free) in your purse in case your sugar drops when you're not at home.    Handouts given during visit include: Carb Counting handouts  Monitoring/Evaluation:  Dietary intake, exercise, take Lantus at consistent time of day, and body weight PRN

## 2013-06-23 NOTE — Telephone Encounter (Signed)
please call patient: OV is due

## 2013-06-23 NOTE — Patient Instructions (Signed)
Plan:  Continue to aim for 2 Carb Choices per meal (30 grams) +/- 1 either way  Continue with current breakfast and dinner meal but consider adding a meal/snack at 9-10 PM of 2 Carb Choices with protein such as PNB Consider  increasing your activity level by walking for 15 minutes daily as tolerated Continue checking BG at alternate times per day as directed by MD  Continue taking medication (Lantus) at consistent time of day such as 8 PM right after supper meal as directed by MD Continue carrying regular mint candies (not sugar free) in your purse in case your sugar drops when you're not at home.

## 2013-07-01 ENCOUNTER — Ambulatory Visit (INDEPENDENT_AMBULATORY_CARE_PROVIDER_SITE_OTHER): Payer: Medicare Other | Admitting: *Deleted

## 2013-07-01 DIAGNOSIS — E538 Deficiency of other specified B group vitamins: Secondary | ICD-10-CM

## 2013-07-01 MED ORDER — CYANOCOBALAMIN 1000 MCG/ML IJ SOLN
1000.0000 ug | Freq: Once | INTRAMUSCULAR | Status: AC
  Administered 2013-07-01: 1000 ug via INTRAMUSCULAR

## 2013-07-26 ENCOUNTER — Other Ambulatory Visit: Payer: Self-pay | Admitting: *Deleted

## 2013-07-26 MED ORDER — INSULIN GLARGINE 100 UNIT/ML SOLOSTAR PEN: PEN_INJECTOR | SUBCUTANEOUS | Status: DC

## 2013-07-29 ENCOUNTER — Ambulatory Visit: Payer: Medicare Other

## 2013-08-02 ENCOUNTER — Encounter: Payer: Self-pay | Admitting: Internal Medicine

## 2013-08-02 ENCOUNTER — Ambulatory Visit (INDEPENDENT_AMBULATORY_CARE_PROVIDER_SITE_OTHER)
Admission: RE | Admit: 2013-08-02 | Discharge: 2013-08-02 | Disposition: A | Discharge status: Home / Self Care | Payer: Medicare Other | Source: Ambulatory Visit | Attending: Internal Medicine | Admitting: Internal Medicine

## 2013-08-02 ENCOUNTER — Other Ambulatory Visit (INDEPENDENT_AMBULATORY_CARE_PROVIDER_SITE_OTHER): Payer: Medicare Other

## 2013-08-02 ENCOUNTER — Ambulatory Visit (INDEPENDENT_AMBULATORY_CARE_PROVIDER_SITE_OTHER): Payer: Medicare Other | Admitting: Internal Medicine

## 2013-08-02 VITALS — BP 130/70 | HR 67 | Temp 98.5°F | Wt 194.8 lb

## 2013-08-02 DIAGNOSIS — M79609 Pain in unspecified limb: Secondary | ICD-10-CM

## 2013-08-02 DIAGNOSIS — Z23 Encounter for immunization: Secondary | ICD-10-CM

## 2013-08-02 DIAGNOSIS — E1049 Type 1 diabetes mellitus with other diabetic neurological complication: Secondary | ICD-10-CM

## 2013-08-02 DIAGNOSIS — E119 Type 2 diabetes mellitus without complications: Secondary | ICD-10-CM

## 2013-08-02 DIAGNOSIS — M79671 Pain in right foot: Secondary | ICD-10-CM

## 2013-08-02 DIAGNOSIS — E538 Deficiency of other specified B group vitamins: Secondary | ICD-10-CM

## 2013-08-02 MED ORDER — CYANOCOBALAMIN 1000 MCG/ML IJ SOLN
1000.0000 ug | Freq: Once | INTRAMUSCULAR | Status: AC
  Administered 2013-08-02: 1000 ug via INTRAMUSCULAR

## 2013-08-02 MED ORDER — GABAPENTIN 300 MG PO CAPS: 300.0000 mg | ORAL_CAPSULE | Freq: Every day | ORAL | Status: DC

## 2013-08-02 NOTE — Patient Instructions (Signed)
It was good to see you today. We have reviewed your prior records including labs and tests today Medications reviewed and updated, Start gabapentin every night for foot pain symptoms -no other changes recommended at this time. Test(s) ordered today. Your results will be released to MyChart (or called to you) after review, usually within 72hours after test completion. If any changes need to be made, you will be notified at that same time. Please schedule followup in 4 months, call sooner if problems.

## 2013-08-02 NOTE — Assessment & Plan Note (Addendum)
Working with endo on same - reports improving control There have been some probable medication, dietary and lifestyle compliance issues here. I have discussed with her the great importance of following the treatment plan exactly as directed in order to achieve a good medical outcome. Check BMET, A1C today   Peripheral neuropathy noted - start Neurontin for same.   Lab Results  Component Value Date   HGBA1C 13.0* 03/30/2013

## 2013-08-02 NOTE — Progress Notes (Signed)
  Subjective:    Patient ID: Beverly Vargas, female    DOB: [birth year], 79 y.o.   MRN: [id number]  HPI  Patient is here today for evaluation of right foot pain and swelling.  Chronic medical issues also reviewed    Patient states she has had nightly right foot swelling with heat and pain for the last several months.  She feels like this is worsening over time.  She was seen by Dr Sharyn Lull who added Lasix.  She does not think this has been helpful.  She reports increased symptoms at night with tingling in her feet bilaterally. Denies trauma or injury. No bruising. No difficulty with WB  Diabetes - she is currently on Metformin and Lantus.  Last HgbA1C in May was 13.  States sugars at home run between 130-150.  She has poor understanding of her diabetes and states that she drinks apple juice when her blood sugar is 130 because she thinks that is low.     Past Medical History  Diagnosis Date  . VITAMIN B12 DEFICIENCY   . ARTHRITIS, GENERALIZED   . SCHATZKI'S RING, HX OF 12/2003    dilation to 15mm on egd  . ANXIETY   . HYPERTENSION   . HYPERLIPIDEMIA   . GERD   . DIABETES MELLITUS, TYPE II   . Nontoxic multinodular goiter   . ANEMIA-NOS     iron def, B12 defic - egd 4/07: gastritis, clo neg; colo 2/05 neg    Review of Systems  Constitutional: Negative for fever, chills, activity change and appetite change.  Respiratory: Positive for shortness of breath. Negative for cough and wheezing.   Cardiovascular: Positive for leg swelling (right). Negative for chest pain.  Gastrointestinal: Negative for nausea, vomiting, diarrhea and constipation.  Neurological: Positive for numbness. Negative for dizziness, syncope, light-headedness and headaches.       Objective:   Physical Exam  Vitals reviewed. Constitutional: She is oriented to person, place, and time. She appears well-developed and well-nourished. No distress.  HENT:  Head: Normocephalic and atraumatic.  Neck: Normal range of motion.  Neck supple. No thyromegaly present.  Cardiovascular: Normal rate and regular rhythm.   No murmur heard. Pulmonary/Chest: Effort normal and breath sounds normal. No respiratory distress. She has no wheezes.  Abdominal: Soft. Bowel sounds are normal. There is no tenderness.  Musculoskeletal: She exhibits edema (RLE).  Lymphadenopathy:    She has no cervical adenopathy.  Neurological: She is alert and oriented to person, place, and time.  Skin: Skin is warm and dry.  Psychiatric: She has a normal mood and affect. Her behavior is normal.   Wt Readings from Last 3 Encounters:  08/02/13 194 lb 12.8 oz (88.361 kg)  05/13/13 192 lb 14.4 oz (87.499 kg)  05/05/13 191 lb (86.637 kg)   BP Readings from Last 3 Encounters:  08/02/13 130/70  05/05/13 142/62  04/19/13 128/78       Assessment & Plan:    Right foot pain, minimal association with soft tissue swelling. No preceding history of trauma but uncontrolled diabetes. Check plain film rule out bony injury. Suspect symptomatic neuropathy. Begin him. Gabapentin each bedtime, titrate as needed. Further orthopedic evaluation if needed based on x-ray report.  History and exam inconsistent with gout or other inflammatory arthropathy  Also see assessment and plan. Medications and labs reviewed

## 2013-08-03 LAB — BASIC METABOLIC PANEL
BUN: 23 mg/dL (ref 6–23)
CO2: 28 mEq/L (ref 19–32)
Chloride: 106 mEq/L (ref 96–112)
GFR: 48.03 mL/min — ABNORMAL LOW (ref >60.00)
Glucose, Bld: 99 mg/dL (ref 70–99)
Potassium: 4.2 mEq/L (ref 3.5–5.1)
Sodium: 142 mEq/L (ref 135–145)

## 2013-08-04 ENCOUNTER — Ambulatory Visit: Payer: Medicare Other | Admitting: Internal Medicine

## 2013-08-18 ENCOUNTER — Other Ambulatory Visit: Payer: Self-pay | Admitting: Internal Medicine

## 2013-08-30 ENCOUNTER — Ambulatory Visit (INDEPENDENT_AMBULATORY_CARE_PROVIDER_SITE_OTHER): Payer: Medicare Other | Admitting: Internal Medicine

## 2013-08-30 ENCOUNTER — Other Ambulatory Visit (INDEPENDENT_AMBULATORY_CARE_PROVIDER_SITE_OTHER): Payer: Medicare Other

## 2013-08-30 ENCOUNTER — Encounter: Payer: Self-pay | Admitting: Internal Medicine

## 2013-08-30 VITALS — BP 130/70 | HR 69 | Temp 98.1°F | Wt 202.0 lb

## 2013-08-30 DIAGNOSIS — E1049 Type 1 diabetes mellitus with other diabetic neurological complication: Secondary | ICD-10-CM

## 2013-08-30 DIAGNOSIS — E538 Deficiency of other specified B group vitamins: Secondary | ICD-10-CM

## 2013-08-30 DIAGNOSIS — M79609 Pain in unspecified limb: Secondary | ICD-10-CM

## 2013-08-30 DIAGNOSIS — M79671 Pain in right foot: Secondary | ICD-10-CM

## 2013-08-30 LAB — CBC WITH DIFFERENTIAL/PLATELET
Basophils Absolute: 0.1 10*3/uL (ref 0.0–0.1)
Basophils Relative: 1.1 % (ref 0.0–3.0)
Eosinophils Absolute: 0.4 10*3/uL (ref 0.0–0.7)
HCT: 32.2 % — ABNORMAL LOW (ref 36.0–46.0)
Hemoglobin: 10.7 g/dL — ABNORMAL LOW (ref 12.0–15.0)
Lymphocytes Relative: 28.5 % (ref 12.0–46.0)
Lymphs Abs: 2.4 10*3/uL (ref 0.7–4.0)
MCHC: 33.4 g/dL (ref 30.0–36.0)
MCV: 87.8 fl (ref 78.0–100.0)
Monocytes Absolute: 0.3 10*3/uL (ref 0.1–1.0)
Neutro Abs: 5.3 10*3/uL (ref 1.4–7.7)
RDW: 14.4 % (ref 11.5–14.6)

## 2013-08-30 LAB — SEDIMENTATION RATE: Sed Rate: 38 mm/hr — ABNORMAL HIGH (ref 0–22)

## 2013-08-30 MED ORDER — CYANOCOBALAMIN 1000 MCG/ML IJ SOLN
1000.0000 ug | Freq: Once | INTRAMUSCULAR | Status: AC
  Administered 2013-08-30: 1000 ug via INTRAMUSCULAR

## 2013-08-30 MED ORDER — GABAPENTIN 300 MG PO CAPS: 300.0000 mg | ORAL_CAPSULE | Freq: Three times a day (TID) | ORAL | Status: AC

## 2013-08-30 NOTE — Progress Notes (Signed)
Pre-visit discussion using our clinic review tool. No additional management support is needed unless otherwise documented below in the visit note.  

## 2013-08-30 NOTE — Progress Notes (Signed)
Subjective:    Patient ID: Beverly Vargas, female    DOB: 04-Nov-1927, 79 y.o.   MRN: [id number]  HPI  Here for follow up - reviewed chronic med issues  Continued pain in right foot - seen 08/02/13 here for same -unchanged gabapentin at bedtime -pain same whether weightbearing or at rest  DM2 - working with endo -reports variable compliance with ongoing medical treatment and no changes in medication dose or frequency. denies adverse side effects related to current therapy. checks sugars 2x/d, no symptoms or signs hypoglycemia  HTN - also following with with cards. denies adverse side effects related to current therapy. no chest pain, headache or change in edema  dyslipidema - prev on simva, changed to atorva by cards summer 2012; reports variable compliance with ongoing medical treatment and no changes in medication dose or frequency. denies adverse side effects related to current therapy.  GERD - reports compliance with ongoing medical treatment and no changes in medication dose or frequency. denies adverse side effects related to current therapy.   Anemia, chronic - long hx iron defic - prior GI eval includes neg colo 2/05 and egd 4/07 with gastritis (clo neg) - on iron pills and dx b12 defic11/2011 - now monthly onging b12 shots - no abdominal pain, melena or brbpr  Past Medical History  Diagnosis Date  . VITAMIN B12 DEFICIENCY   . ARTHRITIS, GENERALIZED   . SCHATZKI'S RING, HX OF 12/2003    dilation to 15mm on egd  . ANXIETY   . HYPERTENSION   . HYPERLIPIDEMIA   . GERD   . DIABETES MELLITUS, TYPE II   . Nontoxic multinodular goiter   . ANEMIA-NOS     iron def, B12 defic - egd 4/07: gastritis, clo neg; colo 2/05 neg    Review of Systems  Constitutional: Positive for fatigue. Negative for fever.  Respiratory: Negative for cough.   Cardiovascular: Negative for chest pain, palpitations and leg swelling.  Musculoskeletal: Negative for arthralgias and joint swelling.        Objective:   Physical Exam BP 130/70  Pulse 69  Temp(Src) 98.1 F (36.7 C) (Oral)  Wt 202 lb (91.627 kg)  BMI 33.09 kg/m2  SpO2 96% Wt Readings from Last 3 Encounters:  08/30/13 202 lb (91.627 kg)  08/02/13 194 lb 12.8 oz (88.361 kg)  05/13/13 192 lb 14.4 oz (87.499 kg)   Constitutional: She is overweight, but appears well-developed and well-nourished. No distress.  Cardiovascular: Normal rate, regular rhythm and normal heart sounds.  No murmur heard. No BLE edema. Pulmonary/Chest: Effort normal and breath sounds normal. No respiratory distress. She has no wheezes.  Musculoskeletal: senile arthritic changes B hands. R foot without soft tissue swelling or deformity change as compared to left. No abnormal warmth. Ankle with full range of motion. Nontender to palpation Psychiatric: She has an anxious mood and affect. Her behavior is normal. Judgment and thought content normal.    Lab Results  Component Value Date   WBC 7.4 03/30/2013   HGB 11.9* 03/30/2013   HCT 35.1* 03/30/2013   PLT 272.0 03/30/2013   CHOL 238* 12/23/2012   TRIG 259* 12/23/2012   HDL 55 12/23/2012   LDLDIRECT 150.2 06/19/2012   ALT 18 12/22/2012   AST 17 12/22/2012   NA 142 08/02/2013   K 4.2 08/02/2013   CL 106 08/02/2013   CREATININE 1.4* 08/02/2013   BUN 23 08/02/2013   CO2 28 08/02/2013   TSH 1.725 12/22/2012   INR 0.90 12/22/2012  HGBA1C 6.3 08/02/2013   MICROALBUR 2.2* 03/30/2013   Lab Results  Component Value Date   IRON 74 06/19/2011   TIBC 295 06/19/2011   FERRITIN 142.3 03/30/2013       Assessment & Plan:   Right foot pain -office visit for same September 22 reviewed. Plain film negative for skeletal injury or foreign body. began gabapentin qhs, increase to 3 times a day now - also refer to sports medicine specialist for further evaluation and treatment. Check CBC and sedimentation rate but no evidence for infection on history or exam. Good cap refill, no evidence for circulatory issue to consider ABIs if  continued problems and no orthopedic abnormality identified  also see problem list. Medications and labs reviewed today.

## 2013-08-30 NOTE — Patient Instructions (Signed)
It was good to see you today.  We have reviewed your prior records including labs and tests today  Medications reviewed and updated, increase gabapentin to 300 mg 3 times daily for foot pain symptoms -no other changes recommended at this time.  Your prescription(s) have been submitted to your pharmacy. Please take as directed and contact our office if you believe you are having problem(s) with the medication(s).  Test(s) ordered today. Your results will be released to MyChart (or called to you) after review, usually within 72hours after test completion. If any changes need to be made, you will be notified at that same time.  we'll make referral to Dr. Katrinka Blazing, foot specialist. Our office will contact you regarding appointment(s) once made.  Please keep scheduled followup in 4 months, call sooner if problems.

## 2013-08-30 NOTE — Assessment & Plan Note (Signed)
Working with endo on same - reports improving control on Lantus There have been some probable medication, dietary and lifestyle compliance issues here. I have discussed with her the great importance of following the treatment plan exactly as directed in order to achieve a good medical outcome. Peripheral neuropathy noted - titrate Neurontin for same now.   Lab Results  Component Value Date   HGBA1C 6.3 08/02/2013

## 2013-09-01 ENCOUNTER — Ambulatory Visit (INDEPENDENT_AMBULATORY_CARE_PROVIDER_SITE_OTHER): Payer: Medicare Other | Admitting: Family Medicine

## 2013-09-01 ENCOUNTER — Other Ambulatory Visit (INDEPENDENT_AMBULATORY_CARE_PROVIDER_SITE_OTHER): Payer: Medicare Other

## 2013-09-01 ENCOUNTER — Ambulatory Visit: Payer: Medicare Other

## 2013-09-01 ENCOUNTER — Encounter: Payer: Self-pay | Admitting: Family Medicine

## 2013-09-01 VITALS — BP 122/70 | HR 70 | Wt 204.0 lb

## 2013-09-01 DIAGNOSIS — M25571 Pain in right ankle and joints of right foot: Secondary | ICD-10-CM | POA: Insufficient documentation

## 2013-09-01 DIAGNOSIS — M79671 Pain in right foot: Secondary | ICD-10-CM

## 2013-09-01 DIAGNOSIS — M79609 Pain in unspecified limb: Secondary | ICD-10-CM

## 2013-09-01 DIAGNOSIS — M25579 Pain in unspecified ankle and joints of unspecified foot: Secondary | ICD-10-CM

## 2013-09-01 NOTE — Assessment & Plan Note (Signed)
Patient does have some right ankle pain it seems to be resolving on its own. Patient does have a history of arthritis previously and does show findings of osteoarthritic changes on ultrasound today. X-rays previously taken were reviewed by me today and do  Show moderate osteoarthritic changes of the anterior ankle mortise which correlate with ultrasound findings. Patient is going to take Lasix 30 mg daily for one week and then go back to 20 mg daily thereafter. Patient will wear compression stockings and I think the fluid decrease would be helpful. Discussed proper shoe fitting. Discuss home exercises Discuss over-the-counter medications that can be beneficial for osteoarthritis Patient will come back in 3-4 weeks. If she continues to have any type of pain I like to see at during an exacerbation to rule out gout. We can always consider doing an intra-articular injection if necessary.

## 2013-09-01 NOTE — Patient Instructions (Signed)
Very nice to meet you You do have some swelling and arthritis in your ankle.  Increase lasix to 30mg  daily for next 7 days then back to 20mg  daily.  Also wear compression stocking daily.  Take tylenol 650 mg three times a day is the best evidence based medicine we have for arthritis.  Aleve 1-2 tabs twice a day with food or ibuprofen 600mg  twice daily with food can be added to tylenol.  Glucosamine sulfate 750mg  twice a day is a supplement that has been shown to help moderate to severe arthritis. Capsaicin topically up to four times a day may also help with pain. Vitamin D 100IU Daily Fish oil 3 grams daily.  Cortisone injections are an option if these interventions do not seem to make a difference or need more relief.  It's important that you continue to stay active. Controlling your weight is important.  Consider physical therapy to strengthen muscles around the joint that hurts to take pressure off of the joint itself. Shoe inserts with good arch support may be helpful. Walker or cane if needed. Heat or ice 20 minutes at a time 3-4 times a day as needed to help with pain. Water aerobics and cycling with low resistance are the best two types of exercise for arthritis. Come back and see me if any redness or pain comes back.

## 2013-09-01 NOTE — Progress Notes (Signed)
  I'm seeing this patient by the request  of:  Rene Paci, MD   CC: Right foot pain  HPI: Patient is a very pleasant 79 year old female coming in with right foot pain for multiple months duration. Patient does not remember any true injury. Patient states that the pain seems to hurt even with activity as well as with rest. Patient describes it is more of a dull aching pain. Patient does have bilateral swelling of the ankles at all time and her primary care physician is trying to help with fluid pills. Patient states that when she has worsening swelling of the ankles it seems to be more painful. Patient denies any type of radiation of pain, or any numbness. Patient is a type II diabetic but does not notice any sores on her feet. Patient has tried some icing as well as compression which both have helped. Patient actually states today the pain is significantly better. Severity at the worst was 6/10 and today is 2/10.   Past medical, surgical, family and social history reviewed. Medications reviewed all in the electronic medical record.   Review of Systems: No headache, visual changes, nausea, vomiting, diarrhea, constipation, dizziness, abdominal pain, skin rash, fevers, chills, night sweats, weight loss, swollen lymph nodes, body aches, joint swelling, muscle aches, chest pain, shortness of breath, mood changes.   Objective:    Blood pressure 122/70, pulse 70, weight 204 lb (92.534 kg), SpO2 94.00%.   General: No apparent distress alert and oriented x3 mood and affect normal, dressed appropriately.  HEENT: Pupils equal, extraocular movements intact Respiratory: Patient's speak in full sentences and does not appear short of breath Cardiovascular: No lower extremity edema, non tender, no erythema Skin: Warm dry intact with no signs of infection or rash on extremities or on axial skeleton. Abdomen: Soft nontender Neuro: Cranial nerves II through XII are intact, neurovascularly intact in all  extremities with 2+ DTRs and 2+ pulses. Lymph: No lymphadenopathy of posterior or anterior cervical chain or axillae bilaterally.  Gait normal with good balance and coordination.  MSK: Non tender with full range of motion and good stability and symmetric strength and tone of shoulders, elbows, wrist, hip, knee bilaterally.  Right ankle exam: On inspection patient does have edema that goes up to proximal tibia. Mrs. 2+ pitting bilaterally and symmetric. Patient's ankle is minimally tender to palpation over the ankle mortise on the right compared to nontender left. Patient does have good range of motion and flexion extension bilaterally with some mild crepitus. She is nontender to palpation over the medial and lateral malleolus. She is nontender over the navicular bone. She is neurovascularly intact distally and symmetric.  Musculoskeletal ultrasound was performed and interpreted by Antoine Primas, M today. Patient's right ankle does show that she does have osteoarthritic changes but mostly sees soft tissue edema. There is no true effusion of the joint noted. Patient also has osteoarthritic changes of the midfoot.  Impression: Osteoarthritis   Impression and Recommendations:     This case required medical decision making of moderate complexity.

## 2013-09-06 ENCOUNTER — Ambulatory Visit: Payer: Medicare Other | Admitting: Internal Medicine

## 2013-09-30 ENCOUNTER — Ambulatory Visit (INDEPENDENT_AMBULATORY_CARE_PROVIDER_SITE_OTHER): Payer: Medicare Other

## 2013-09-30 DIAGNOSIS — E538 Deficiency of other specified B group vitamins: Secondary | ICD-10-CM

## 2013-09-30 MED ORDER — CYANOCOBALAMIN 1000 MCG/ML IJ SOLN
1000.0000 ug | Freq: Once | INTRAMUSCULAR | Status: AC
  Administered 2013-09-30: 1000 ug via INTRAMUSCULAR

## 2013-11-01 ENCOUNTER — Ambulatory Visit (INDEPENDENT_AMBULATORY_CARE_PROVIDER_SITE_OTHER): Payer: Medicare Other | Admitting: *Deleted

## 2013-11-01 DIAGNOSIS — E538 Deficiency of other specified B group vitamins: Secondary | ICD-10-CM

## 2013-11-01 MED ORDER — CYANOCOBALAMIN 1000 MCG/ML IJ SOLN
1000.0000 ug | Freq: Once | INTRAMUSCULAR | Status: AC
  Administered 2013-11-01: 1000 ug via INTRAMUSCULAR

## 2013-11-02 ENCOUNTER — Ambulatory Visit: Payer: Medicare Other

## 2013-11-22 ENCOUNTER — Other Ambulatory Visit: Payer: Self-pay | Admitting: *Deleted

## 2013-11-22 MED ORDER — LOSARTAN POTASSIUM 50 MG PO TABS: 50.0000 mg | ORAL_TABLET | Freq: Every evening | ORAL | Status: AC

## 2013-11-22 NOTE — Telephone Encounter (Signed)
Left msg on vm stating needing refills on her losartan was previously rx by Dr. August Saucerean been out for a week. Caled pt back inform her will send to [facility]...Raechel Chute/lmb

## 2013-12-02 ENCOUNTER — Ambulatory Visit (INDEPENDENT_AMBULATORY_CARE_PROVIDER_SITE_OTHER): Payer: Medicare Other | Admitting: *Deleted

## 2013-12-02 DIAGNOSIS — E538 Deficiency of other specified B group vitamins: Secondary | ICD-10-CM

## 2013-12-02 MED ORDER — CYANOCOBALAMIN 1000 MCG/ML IJ SOLN
1000.0000 ug | Freq: Once | INTRAMUSCULAR | Status: AC
  Administered 2013-12-02: 1000 ug via INTRAMUSCULAR

## 2013-12-10 ENCOUNTER — Encounter: Payer: Self-pay | Admitting: Internal Medicine

## 2013-12-10 ENCOUNTER — Ambulatory Visit (INDEPENDENT_AMBULATORY_CARE_PROVIDER_SITE_OTHER)
Admission: RE | Admit: 2013-12-10 | Discharge: 2013-12-10 | Disposition: A | Discharge status: Home / Self Care | Payer: Medicare Other | Source: Ambulatory Visit | Attending: Internal Medicine | Admitting: Internal Medicine

## 2013-12-10 ENCOUNTER — Other Ambulatory Visit (INDEPENDENT_AMBULATORY_CARE_PROVIDER_SITE_OTHER): Payer: Medicare Other

## 2013-12-10 ENCOUNTER — Ambulatory Visit (INDEPENDENT_AMBULATORY_CARE_PROVIDER_SITE_OTHER): Payer: Medicare Other | Admitting: Internal Medicine

## 2013-12-10 VITALS — BP 122/70 | HR 74 | Temp 97.9°F | Wt 200.0 lb

## 2013-12-10 DIAGNOSIS — E119 Type 2 diabetes mellitus without complications: Secondary | ICD-10-CM

## 2013-12-10 DIAGNOSIS — R0600 Dyspnea, unspecified: Secondary | ICD-10-CM

## 2013-12-10 DIAGNOSIS — R0609 Other forms of dyspnea: Secondary | ICD-10-CM

## 2013-12-10 DIAGNOSIS — J9801 Acute bronchospasm: Secondary | ICD-10-CM

## 2013-12-10 DIAGNOSIS — R0989 Other specified symptoms and signs involving the circulatory and respiratory systems: Secondary | ICD-10-CM

## 2013-12-10 DIAGNOSIS — D649 Anemia, unspecified: Secondary | ICD-10-CM

## 2013-12-10 LAB — BASIC METABOLIC PANEL
BUN: 15 mg/dL (ref 6–23)
CHLORIDE: 105 meq/L (ref 96–112)
CO2: 25 mEq/L (ref 19–32)
CREATININE: 1 mg/dL (ref 0.4–1.2)
Calcium: 9.3 mg/dL (ref 8.4–10.5)
GFR: 71.13 mL/min (ref >60.00)
Glucose, Bld: 155 mg/dL — ABNORMAL HIGH (ref 70–99)
Potassium: 4.3 mEq/L (ref 3.5–5.1)
Sodium: 138 mEq/L (ref 135–145)

## 2013-12-10 LAB — HEMOGLOBIN A1C: HEMOGLOBIN A1C: 7 % — AB (ref 4.6–6.5)

## 2013-12-10 LAB — CBC WITH DIFFERENTIAL/PLATELET
BASOS ABS: 0.1 10*3/uL (ref 0.0–0.1)
BASOS PCT: 1.1 % (ref 0.0–3.0)
EOS ABS: 0.4 10*3/uL (ref 0.0–0.7)
Eosinophils Relative: 4 % (ref 0.0–5.0)
HCT: 34.1 % — ABNORMAL LOW (ref 36.0–46.0)
Hemoglobin: 11.1 g/dL — ABNORMAL LOW (ref 12.0–15.0)
Lymphocytes Relative: 28.1 % (ref 12.0–46.0)
Lymphs Abs: 3 10*3/uL (ref 0.7–4.0)
MCHC: 32.5 g/dL (ref 30.0–36.0)
MCV: 89.6 fl (ref 78.0–100.0)
MONO ABS: 0.5 10*3/uL (ref 0.1–1.0)
Monocytes Relative: 5.1 % (ref 3.0–12.0)
NEUTROS PCT: 61.7 % (ref 43.0–77.0)
Neutro Abs: 6.6 10*3/uL (ref 1.4–7.7)
Platelets: 285 10*3/uL (ref 150.0–400.0)
RBC: 3.81 Mil/uL — AB (ref 3.87–5.11)
RDW: 14.1 % (ref 11.5–14.6)
WBC: 10.6 10*3/uL — ABNORMAL HIGH (ref 4.5–10.5)

## 2013-12-10 LAB — LIPID PANEL
Cholesterol: 195 mg/dL (ref 0–200)
HDL: 63.6 mg/dL (ref >39.00)
LDL Cholesterol: 100 mg/dL — ABNORMAL HIGH (ref 0–99)
Total CHOL/HDL Ratio: 3
Triglycerides: 157 mg/dL — ABNORMAL HIGH (ref 0.0–149.0)
VLDL: 31.4 mg/dL (ref 0.0–40.0)

## 2013-12-10 MED ORDER — ALBUTEROL SULFATE HFA 108 (90 BASE) MCG/ACT IN AERS: 2.0000 | INHALATION_SPRAY | Freq: Four times a day (QID) | RESPIRATORY_TRACT | Status: AC | PRN

## 2013-12-10 MED ORDER — PREDNISONE (PAK) 10 MG PO TABS: ORAL_TABLET | ORAL | Status: DC

## 2013-12-10 MED ORDER — FUROSEMIDE 20 MG PO TABS: 20.0000 mg | ORAL_TABLET | Freq: Every day | ORAL | Status: DC

## 2013-12-10 NOTE — Progress Notes (Signed)
Subjective:    Patient ID: Beverly Vargas, female    DOB: 09/23/1927, 80 y.o.   MRN: [id number]  Shortness of Breath This is a new problem. The current episode started 1 to 4 weeks ago. The problem occurs every several days. The problem has been waxing and waning. Associated symptoms include orthopnea, sputum production and wheezing. Pertinent negatives include no chest pain, fever, headaches, hemoptysis, neck pain, rhinorrhea, sore throat, swollen glands or vomiting. The symptoms are aggravated by any activity. The patient has no known risk factors for DVT/PE. She has tried rest for the symptoms. The treatment provided mild relief. Her past medical history is significant for allergies. There is no history of aspirin allergies, CAD, COPD, a heart failure or pneumonia.  Wheezing  Associated symptoms include shortness of breath and sputum production. Pertinent negatives include no chest pain, fever, headaches, hemoptysis, neck pain, rhinorrhea, sore throat, swollen glands or vomiting. There is no history of CAD, COPD, heart failure or pneumonia.    Past Medical History  Diagnosis Date  . VITAMIN B12 DEFICIENCY   . ARTHRITIS, GENERALIZED   . SCHATZKI'S RING, HX OF 12/2003    dilation to 15mm on egd  . ANXIETY   . HYPERTENSION   . HYPERLIPIDEMIA   . GERD   . DIABETES MELLITUS, TYPE II   . Nontoxic multinodular goiter   . ANEMIA-NOS     iron def, B12 defic - egd 4/07: gastritis, clo neg; colo 2/05 neg    Review of Systems  Constitutional: Negative for fever.  HENT: Negative for rhinorrhea and sore throat.   Respiratory: Positive for sputum production, shortness of breath and wheezing. Negative for hemoptysis.   Cardiovascular: Positive for orthopnea. Negative for chest pain.  Gastrointestinal: Negative for vomiting.  Musculoskeletal: Negative for neck pain.  Neurological: Negative for headaches.       Objective:   Physical Exam BP 122/70  Pulse 74  Temp(Src) 97.9 F (36.6 C)  (Oral)  Wt 200 lb (90.719 kg)  SpO2 93% Wt Readings from Last 3 Encounters:  12/10/13 200 lb (90.719 kg)  09/01/13 204 lb (92.534 kg)  08/30/13 202 lb (91.627 kg)   Constitutional: She is obese, but appears well-developed and well-nourished. No distress.  HENT: Head: Normocephalic and atraumatic. Ears: B TMs ok, no erythema or effusion; Nose: Nose normal. Mouth/Throat: Oropharynx is clear and moist. No oropharyngeal exudate.  Eyes: Conjunctivae and EOM are normal. Pupils are equal, round, and reactive to light. No scleral icterus.  Neck: Normal range of motion. Neck supple. No JVD present. No thyromegaly present.  Cardiovascular: Normal rate, regular rhythm and normal heart sounds.  No murmur heard. No BLE edema. Pulmonary/Chest: Effort normal and breath sounds normal. No respiratory distress. She has soft end exp wheeze Psychiatric: She has a normal mood and affect. Her behavior is normal. Judgment and thought content normal.   Lab Results  Component Value Date   WBC 8.6 08/30/2013   HGB 10.7* 08/30/2013   HCT 32.2* 08/30/2013   PLT 264.0 08/30/2013   GLUCOSE 99 08/02/2013   CHOL 238* 12/23/2012   TRIG 259* 12/23/2012   HDL 55 12/23/2012   LDLDIRECT 150.2 06/19/2012   LDLCALC 131* 12/23/2012   ALT 18 12/22/2012   AST 17 12/22/2012   NA 142 08/02/2013   K 4.2 08/02/2013   CL 106 08/02/2013   CREATININE 1.4* 08/02/2013   BUN 23 08/02/2013   CO2 28 08/02/2013   TSH 1.725 12/22/2012   INR 0.90 12/22/2012  HGBA1C 6.3 08/02/2013   MICROALBUR 2.2* 03/30/2013        Assessment & Plan:   Acute bronchospasm - pred taper and Alb MDI -  no sputum, fever or evidence for infection.  euvolemic without evidence for CHF Check labs and CXR Continue daily Lasix and follow up cards as planned

## 2013-12-10 NOTE — Progress Notes (Signed)
Pre-visit discussion using our clinic review tool. No additional management support is needed unless otherwise documented below in the visit note.  

## 2013-12-10 NOTE — Assessment & Plan Note (Signed)
Iron defic chronic and B12 defic dx 09/2010 - ?celiac On oral iron and B12 shots -  Prior GI eval reviewed: EGD 4/07 with gastritis, clo neg and colo 2/05 negative Check cbc now, esp with daily meloxicam for OA as rx'd 01/2013

## 2013-12-10 NOTE — Patient Instructions (Addendum)
It was good to see you today.  We have reviewed your prior records including labs and tests today  Medications reviewed and updated, Prednisone taper x 6 days and Albuterol inhaler for wheeze as needed No other changes recommended at this time.  Your prescription(s) have been submitted to your pharmacy. Please take as directed and contact our office if you believe you are having problem(s) with the medication(s).  Test(s) ordered today. Your results will be released to [place] (or called to you) after review, usually within 72hours after test completion. If any changes need to be made, you will be notified at that same time.  Please schedule followup in 4 months, call sooner if problems.  Diabetes and Standards of Medical Care  Diabetes is complicated. You may find that your diabetes team includes a dietitian, nurse, diabetes educator, eye doctor, and more. To help everyone know what is going on and to help you get the care you deserve, the following schedule of care was developed to help keep you on track. Below are the tests, exams, vaccines, medicines, education, and plans you will need. HbA1c test This test shows how well you have controlled your glucose over the past 2 3 months. It is used to see if your diabetes management plan needs to be adjusted.   It is performed at least 2 times a year if you are meeting treatment goals.  It is performed 4 times a year if therapy has changed or if you are not meeting treatment goals. Blood pressure test  This test is performed at every routine medical visit. The goal is less than 140/90 mmHg for most people, but 130/80 mmHg in some cases. Ask your health care provider about your goal. Dental exam  Follow up with the dentist regularly. Eye exam  If you are diagnosed with type 1 diabetes as a child, get an exam upon reaching the age of 68 years or older and have had diabetes for 3 5 years. Yearly eye exams are recommended after that initial eye  exam.  If you are diagnosed with type 1 diabetes as an adult, get an exam within 5 years of diagnosis and then yearly.  If you are diagnosed with type 2 diabetes, get an exam as soon as possible after the diagnosis and then yearly. Foot care exam  Visual foot exams are performed at every routine medical visit. The exams check for cuts, injuries, or other problems with the feet.  A comprehensive foot exam should be done yearly. This includes visual inspection as well as assessing foot pulses and testing for loss of sensation.  Check your feet nightly for cuts, injuries, or other problems with your feet. Tell your health care provider if anything is not healing. Kidney function test (urine microalbumin)  This test is performed once a year.  Type 1 diabetes: The first test is performed 5 years after diagnosis.  Type 2 diabetes: The first test is performed at the time of diagnosis.  A serum creatinine and estimated glomerular filtration rate (eGFR) test is done once a year to assess the level of chronic kidney disease (CKD), if present. Lipid profile (cholesterol, HDL, LDL, triglycerides)  Performed every 5 years for most people.  The goal for LDL is less than 100 mg/dL. If you are at high risk, the goal is less than 70 mg/dL.  The goal for HDL is 40 mg/dL 50 mg/dL for men and 50 mg/dL 60 mg/dL for women. An HDL cholesterol of 60 mg/dL or  higher gives some protection against heart disease.  The goal for triglycerides is less than 150 mg/dL. Influenza vaccine, pneumococcal vaccine, and hepatitis B vaccine  The influenza vaccine is recommended yearly.  The pneumococcal vaccine is generally given once in a lifetime. However, there are some instances when another vaccination is recommended. Check with your health care provider.  The hepatitis B vaccine is also recommended for adults with diabetes. Diabetes self-management education  Education is recommended at diagnosis and ongoing as  needed. Treatment plan  Your treatment plan is reviewed at every medical visit. Document Released: 08/25/2009 Document Revised: 06/30/2013 Document Reviewed: 03/30/2013 [facility] Patient Information 2014 [place].

## 2013-12-10 NOTE — Assessment & Plan Note (Signed)
Working with endo intermittently on same - reports improving control There have been some probable medication, dietary and lifestyle compliance issues here. I have discussed with her the great importance of following the treatment plan exactly as directed in order to achieve a good medical outcome. Check BMET, A1C today  Peripheral neuropathy symptoms - started Neurontin for same 07/2013.   Lab Results  Component Value Date   HGBA1C 6.3 08/02/2013

## 2013-12-13 ENCOUNTER — Telehealth: Payer: Self-pay | Admitting: *Deleted

## 2013-12-13 NOTE — Telephone Encounter (Signed)
Chest xray shows some empysema changes but no evidence for CHF or pneumonia -  No med changes recommended  ROV if continued symptoms or if unimproved with rx'd meds

## 2013-12-13 NOTE — Telephone Encounter (Signed)
Patient phoned requesting cxr results.  Please advise.  CB# [phone]

## 2013-12-13 NOTE — Telephone Encounter (Signed)
Agree with advice as given - OV here if unable to see cards sooner due to continued symptoms

## 2013-12-13 NOTE — Telephone Encounter (Signed)
Reviewed again with patient. With her conversational dyspnea, I again recommended she try to see cardiologist sooner, but if they couldn't get her in any sooner, to call us back & see if PCP could see sooner.

## 2013-12-13 NOTE — Telephone Encounter (Signed)
Notified patient of MD response.  States her DOE has not improved.  Recommended her scheduling, but she has an appt with her cardiologist on 2/13.  States she would prefer to wait until she sees cardiologist. Recommended she try to get in to see cardiologist sooner than the 13th.  She was SOB just in conversation with me.  Further states that she can "feel her heart beating faster".

## 2013-12-28 ENCOUNTER — Ambulatory Visit: Payer: Medicare Other | Admitting: *Deleted

## 2014-01-03 ENCOUNTER — Ambulatory Visit (INDEPENDENT_AMBULATORY_CARE_PROVIDER_SITE_OTHER): Payer: Medicare Other | Admitting: Internal Medicine

## 2014-01-03 ENCOUNTER — Ambulatory Visit (INDEPENDENT_AMBULATORY_CARE_PROVIDER_SITE_OTHER): Payer: Medicare Other | Admitting: *Deleted

## 2014-01-03 DIAGNOSIS — E119 Type 2 diabetes mellitus without complications: Secondary | ICD-10-CM

## 2014-01-03 DIAGNOSIS — E538 Deficiency of other specified B group vitamins: Secondary | ICD-10-CM

## 2014-01-03 MED ORDER — CYANOCOBALAMIN 1000 MCG/ML IJ SOLN: 1000.0000 ug | INTRAMUSCULAR | Status: AC

## 2014-01-03 MED ORDER — CYANOCOBALAMIN 1000 MCG/ML IJ SOLN
1000.0000 ug | Freq: Once | INTRAMUSCULAR | Status: AC
  Administered 2014-01-03: 1000 ug via INTRAMUSCULAR

## 2014-01-03 MED ORDER — CYANOCOBALAMIN 1000 MCG/ML IJ SOLN
1000.0000 ug | Freq: Once | INTRAMUSCULAR | Status: DC
Start: 2014-01-03 — End: 2014-01-03

## 2014-01-03 NOTE — Progress Notes (Signed)
Beverly Vargas [id number]/03/30 01/03/2014   No chief complaint on file.   Subjective  HPI  Past Medical History  Diagnosis Date  . VITAMIN B12 DEFICIENCY   . ARTHRITIS, GENERALIZED   . SCHATZKI'S RING, HX OF 12/2003    dilation to 15mm on egd  . ANXIETY   . HYPERTENSION   . HYPERLIPIDEMIA   . GERD   . DIABETES MELLITUS, TYPE II   . Nontoxic multinodular goiter   . ANEMIA-NOS     iron def, B12 defic - egd 4/07: gastritis, clo neg; colo 2/05 neg    Past Surgical History  Procedure Laterality Date  . Abdominal hysterectomy      Family History  Problem Relation Age of Onset  . Arthritis Mother   . Hypertension Mother   . Diabetes Mother   . Hypertension Father   . Diabetes Father   . Arthritis Maternal Grandmother     History  Substance Use Topics  . Smoking status: Never Smoker   . Smokeless tobacco: Not on file     Comment: Divorced, lives alone- dtr lives in town. 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 Alcohol Use: No    Current Outpatient Prescriptions on File Prior to Visit  Medication Sig Dispense Refill  . albuterol (PROVENTIL HFA;VENTOLIN HFA) 108 (90 BASE) MCG/ACT inhaler Inhale 2 puffs into the lungs every 6 (six) hours as needed for wheezing or shortness of breath.  1 Inhaler  0  . amLODipine (NORVASC) 5 MG tablet Take 1 tablet (5 mg total) by mouth daily.  30 tablet  11  . aspirin 81 MG tablet Take 81 mg by mouth every evening.       . Aspirin-Salicylamide-Caffeine (BC HEADACHE POWDER PO) Take 1 packet by mouth daily as needed (for pain).      Marland Kitchen. atorvastatin (LIPITOR) 40 MG tablet Take 40 mg by mouth every evening.       . cholecalciferol (VITAMIN D) 1000 UNITS tablet Take 1,000 Units by mouth every evening.       . cyanocobalamin (,VITAMIN B-12,) 1000 MCG/ML injection Inject 1,000 mcg into the muscle every 30 (thirty) days.        . Ferrous Fumarate (HIGH POTENCY IRON) 86 (27 FE) MG CAPS Take 1 capsule  by mouth every evening.       . furosemide (LASIX) 20 MG tablet Take 1 tablet (20 mg total) by mouth daily.  30 tablet  5  . gabapentin (NEURONTIN) 300 MG capsule Take 1 capsule (300 mg total) by mouth 3 (three) times daily.  90 capsule  2  . glucose blood (ONE TOUCH ULTRA TEST) test strip 1 each by Other route 2 (two) times daily. And lancets 2/day 250.01  100 each  12  . Insulin Glargine 100 UNIT/ML SOPN Inject 20 Units into the skin daily. And pen needles 1/day  5 pen  0  . Insulin Pen Needle 29G X 12MM MISC 1 Device by Does not apply route daily.  30 each  11  . losartan (COZAAR) 50 MG tablet Take 1 tablet (50 mg total) by mouth every evening.  30 tablet  5  . meloxicam (MOBIC) 7.5 MG tablet take 1 by mouth once daily if needed for pain  30 tablet  3  . metFORMIN (GLUCOPHAGE) 1000 MG tablet Take 1 tablet (1,000 mg total) by mouth daily with breakfast.  30 tablet  5  . metoprolol succinate (TOPROL-XL) 25 MG  24 hr tablet Take 1 by mouth daily      . Multiple Vitamin (MULTIVITAMIN WITH MINERALS) TABS Take 1 tablet by mouth daily.      Marland Kitchen omeprazole (PRILOSEC OTC) 20 MG tablet Take 20 mg by mouth daily.        Letta Pate DELICA LANCETS 33G MISC Use as directed      . predniSONE (STERAPRED UNI-PAK) 10 MG tablet Take by mouth as directed. As directed x 6 days  21 tablet  0   No current facility-administered medications on file prior to visit.    Allergies: No Known Allergies  ROS     Objective  There were no vitals filed for this visit.  Physical Exam  BP Readings from Last 3 Encounters:  12/10/13 122/70  09/01/13 122/70  08/30/13 130/70    Wt Readings from Last 3 Encounters:  12/10/13 200 lb (90.719 kg)  09/01/13 204 lb (92.534 kg)  08/30/13 202 lb (91.627 kg)    Lab Results  Component Value Date   WBC 10.6* 12/10/2013   HGB 11.1* 12/10/2013   HCT 34.1* 12/10/2013   PLT 285.0 12/10/2013   GLUCOSE 155* 12/10/2013   CHOL 195 12/10/2013   TRIG 157.0* 12/10/2013   HDL 63.60  12/10/2013   LDLDIRECT 150.2 06/19/2012   LDLCALC 100* 12/10/2013   ALT 18 12/22/2012   AST 17 12/22/2012   NA 138 12/10/2013   K 4.3 12/10/2013   CL 105 12/10/2013   CREATININE 1.0 12/10/2013   BUN 15 12/10/2013   CO2 25 12/10/2013   TSH 1.725 12/22/2012   INR 0.90 12/22/2012   HGBA1C 7.0* 12/10/2013   MICROALBUR 2.2* 03/30/2013    Dg Chest 2 View  12/10/2013   CLINICAL DATA:  Cough and wheezing for 2 weeks, hypertension, diabetes  EXAM: CHEST  2 VIEW  COMPARISON:  12/22/2012  FINDINGS: Enlargement of cardiac silhouette.  Atherosclerotic calcification aorta.  Mediastinal contours and pulmonary vascularity normal.  Emphysematous changes with bibasilar opacities increased since previous exam, question atelectasis or infiltrate.  Remaining lungs clear.  No pleural effusion or pneumothorax.  Bones diffusely demineralized.  IMPRESSION: Enlargement of cardiac silhouette.  Emphysematous changes with slightly increased bibasilar opacities question atelectasis or infiltrate.   Electronically Signed   By: Ulyses Southward M.D.   On: 12/10/2013 16:59     Assessment and Plan   Problem list, Labs, Imaging ordered and WHY  Etc. To asses for pneumia vs. other,  Treatment ordered/changed,    Patient was instructed to notify the office if any new symptoms emerged or the current symptoms become worse.   No Follow-up on file. Ollen Gross, Student-PA    A user error has taken place: closed for administrative reasons

## 2014-01-11 ENCOUNTER — Other Ambulatory Visit: Payer: Self-pay | Admitting: Internal Medicine

## 2014-01-14 ENCOUNTER — Other Ambulatory Visit (HOSPITAL_COMMUNITY): Payer: Self-pay | Admitting: Cardiology

## 2014-01-14 DIAGNOSIS — R079 Chest pain, unspecified: Secondary | ICD-10-CM

## 2014-01-24 ENCOUNTER — Encounter (HOSPITAL_COMMUNITY)
Admission: RE | Admit: 2014-01-24 | Discharge: 2014-01-24 | Disposition: A | Discharge status: Home / Self Care | Payer: Medicare Other | Source: Ambulatory Visit | Attending: Cardiology | Admitting: Cardiology

## 2014-01-24 ENCOUNTER — Other Ambulatory Visit: Payer: Self-pay

## 2014-01-24 DIAGNOSIS — R079 Chest pain, unspecified: Secondary | ICD-10-CM

## 2014-01-24 LAB — HEMOGLOBIN A1C
HEMOGLOBIN A1C: 9 % — AB (ref <5.7)
MEAN PLASMA GLUCOSE: 212 mg/dL — AB (ref <117)

## 2014-01-24 LAB — BASIC METABOLIC PANEL
BUN: 27 mg/dL — ABNORMAL HIGH (ref 6–23)
CO2: 23 mEq/L (ref 19–32)
Calcium: 9.8 mg/dL (ref 8.4–10.5)
Chloride: 102 mEq/L (ref 96–112)
Creatinine, Ser: 1.21 mg/dL — ABNORMAL HIGH (ref 0.50–1.10)
GFR calc non Af Amer: 40 mL/min — ABNORMAL LOW (ref >90)
GFR, EST AFRICAN AMERICAN: 46 mL/min — AB (ref >90)
Glucose, Bld: 193 mg/dL — ABNORMAL HIGH (ref 70–99)
POTASSIUM: 4.3 meq/L (ref 3.7–5.3)
SODIUM: 141 meq/L (ref 137–147)

## 2014-01-24 LAB — HEPATIC FUNCTION PANEL
ALT: 15 U/L (ref 0–35)
AST: 16 U/L (ref 0–37)
Albumin: 3.7 g/dL (ref 3.5–5.2)
Alkaline Phosphatase: 131 U/L — ABNORMAL HIGH (ref 39–117)
Total Bilirubin: 0.8 mg/dL (ref 0.3–1.2)
Total Protein: 7.4 g/dL (ref 6.0–8.3)

## 2014-01-24 LAB — LIPID PANEL
Cholesterol: 214 mg/dL — ABNORMAL HIGH (ref 0–200)
HDL: 67 mg/dL (ref >39)
LDL Cholesterol: 105 mg/dL — ABNORMAL HIGH (ref 0–99)
TRIGLYCERIDES: 208 mg/dL — AB (ref <150)
Total CHOL/HDL Ratio: 3.2 RATIO
VLDL: 42 mg/dL — AB (ref 0–40)

## 2014-01-24 MED ORDER — REGADENOSON 0.4 MG/5ML IV SOLN
INTRAVENOUS | Status: AC
  Filled 2014-01-24: qty 5

## 2014-01-24 MED ORDER — TECHNETIUM TC 99M SESTAMIBI GENERIC - CARDIOLITE
10.0000 | Freq: Once | INTRAVENOUS | Status: AC | PRN
  Administered 2014-01-24: 10 via INTRAVENOUS

## 2014-01-24 MED ORDER — REGADENOSON 0.4 MG/5ML IV SOLN: 0.4000 mg | Freq: Once | INTRAVENOUS | Status: DC

## 2014-01-24 MED ORDER — TECHNETIUM TC 99M SESTAMIBI GENERIC - CARDIOLITE
30.0000 | Freq: Once | INTRAVENOUS | Status: AC | PRN
  Administered 2014-01-24: 30 via INTRAVENOUS

## 2014-02-01 ENCOUNTER — Ambulatory Visit: Payer: Medicare Other

## 2014-02-02 ENCOUNTER — Ambulatory Visit (INDEPENDENT_AMBULATORY_CARE_PROVIDER_SITE_OTHER): Payer: Medicare Other

## 2014-02-02 DIAGNOSIS — E538 Deficiency of other specified B group vitamins: Secondary | ICD-10-CM

## 2014-02-02 MED ORDER — CYANOCOBALAMIN 1000 MCG/ML IJ SOLN
1000.0000 ug | Freq: Once | INTRAMUSCULAR | Status: AC
  Administered 2014-02-02: 1000 ug via INTRAMUSCULAR

## 2014-02-15 ENCOUNTER — Other Ambulatory Visit: Payer: Self-pay

## 2014-02-15 DIAGNOSIS — Z1231 Encounter for screening mammogram for malignant neoplasm of breast: Secondary | ICD-10-CM

## 2014-02-28 ENCOUNTER — Ambulatory Visit
Admission: RE | Admit: 2014-02-28 | Discharge: 2014-02-28 | Disposition: A | Discharge status: Home / Self Care | Payer: Self-pay | Source: Ambulatory Visit

## 2014-02-28 DIAGNOSIS — Z1231 Encounter for screening mammogram for malignant neoplasm of breast: Secondary | ICD-10-CM

## 2014-03-07 ENCOUNTER — Ambulatory Visit (INDEPENDENT_AMBULATORY_CARE_PROVIDER_SITE_OTHER): Payer: Medicare Other | Admitting: *Deleted

## 2014-03-07 DIAGNOSIS — E538 Deficiency of other specified B group vitamins: Secondary | ICD-10-CM

## 2014-03-07 MED ORDER — CYANOCOBALAMIN 1000 MCG/ML IJ SOLN
1000.0000 ug | Freq: Once | INTRAMUSCULAR | Status: AC
  Administered 2014-03-07: 1000 ug via INTRAMUSCULAR

## 2014-03-08 ENCOUNTER — Ambulatory Visit: Payer: Medicare Other

## 2014-03-09 LAB — HM DIABETES EYE EXAM

## 2014-03-16 ENCOUNTER — Encounter: Payer: Self-pay | Admitting: Internal Medicine

## 2014-04-04 ENCOUNTER — Encounter: Payer: Self-pay | Admitting: Internal Medicine

## 2014-04-05 ENCOUNTER — Ambulatory Visit: Payer: Medicare Other

## 2014-04-06 ENCOUNTER — Other Ambulatory Visit (INDEPENDENT_AMBULATORY_CARE_PROVIDER_SITE_OTHER): Payer: Medicare Other

## 2014-04-06 ENCOUNTER — Ambulatory Visit (INDEPENDENT_AMBULATORY_CARE_PROVIDER_SITE_OTHER): Payer: Medicare Other | Admitting: Internal Medicine

## 2014-04-06 ENCOUNTER — Encounter: Payer: Self-pay | Admitting: Internal Medicine

## 2014-04-06 VITALS — BP 142/68 | HR 61 | Temp 98.0°F | Wt 196.4 lb

## 2014-04-06 DIAGNOSIS — F329 Major depressive disorder, single episode, unspecified: Secondary | ICD-10-CM

## 2014-04-06 DIAGNOSIS — I10 Essential (primary) hypertension: Secondary | ICD-10-CM

## 2014-04-06 DIAGNOSIS — F32A Depression, unspecified: Secondary | ICD-10-CM

## 2014-04-06 DIAGNOSIS — F3289 Other specified depressive episodes: Secondary | ICD-10-CM

## 2014-04-06 DIAGNOSIS — E119 Type 2 diabetes mellitus without complications: Secondary | ICD-10-CM

## 2014-04-06 DIAGNOSIS — E538 Deficiency of other specified B group vitamins: Secondary | ICD-10-CM

## 2014-04-06 LAB — CBC WITH DIFFERENTIAL/PLATELET
Basophils Absolute: 0.4 10*3/uL — ABNORMAL HIGH (ref 0.0–0.1)
Basophils Relative: 4.6 % — ABNORMAL HIGH (ref 0.0–3.0)
Eosinophils Absolute: 0.7 10*3/uL (ref 0.0–0.7)
Eosinophils Relative: 7.6 % — ABNORMAL HIGH (ref 0.0–5.0)
HEMATOCRIT: 33.8 % — AB (ref 36.0–46.0)
Hemoglobin: 11.2 g/dL — ABNORMAL LOW (ref 12.0–15.0)
LYMPHS ABS: 3.2 10*3/uL (ref 0.7–4.0)
Lymphocytes Relative: 37 % (ref 12.0–46.0)
MCHC: 33.1 g/dL (ref 30.0–36.0)
MCV: 89.5 fl (ref 78.0–100.0)
MONO ABS: 0.7 10*3/uL (ref 0.1–1.0)
Monocytes Relative: 8.4 % (ref 3.0–12.0)
Neutro Abs: 3.7 10*3/uL (ref 1.4–7.7)
Neutrophils Relative %: 42.4 % — ABNORMAL LOW (ref 43.0–77.0)
PLATELETS: 255 10*3/uL (ref 150.0–400.0)
RBC: 3.78 Mil/uL — ABNORMAL LOW (ref 3.87–5.11)
RDW: 14.1 % (ref 11.5–15.5)
WBC: 8.7 10*3/uL (ref 4.0–10.5)

## 2014-04-06 LAB — HEMOGLOBIN A1C: HEMOGLOBIN A1C: 7.4 % — AB (ref 4.6–6.5)

## 2014-04-06 LAB — TSH: TSH: 1.03 u[IU]/mL (ref 0.35–4.50)

## 2014-04-06 MED ORDER — PAROXETINE HCL 10 MG PO TABS: 10.0000 mg | ORAL_TABLET | Freq: Every day | ORAL | Status: AC

## 2014-04-06 MED ORDER — CYANOCOBALAMIN 1000 MCG/ML IJ SOLN
1000.0000 ug | Freq: Once | INTRAMUSCULAR | Status: AC
  Administered 2014-04-06: 1000 ug via INTRAMUSCULAR

## 2014-04-06 NOTE — Assessment & Plan Note (Signed)
Several low grade symptoms - Check screening labs Start low dose SSRI - we reviewed potential risk/benefit and possible side effects - pt understands and agrees to same  Verified no SI/HI follow up 6-12 wks, sooner if problems

## 2014-04-06 NOTE — Patient Instructions (Signed)
It was good to see you today.  We have reviewed your prior records including labs and tests today  Test(s) ordered today. Your results will be released to MyChart (or called to you) after review, usually within 72hours after test completion. If any changes need to be made, you will be notified at that same time.  Medications reviewed and updated Star Paxil one daily for mood and nerve symptoms , no other changes recommended at this time. Your prescription(s) have been submitted to your pharmacy. Please take as directed and contact our office if you believe you are having problem(s) with the medication(s).  Work on lifestyle changes as discussed (low fat, low carb, increased protein diet; improved exercise efforts; weight loss) to control sugar, blood pressure and cholesterol levels and/or reduce risk of developing other medical problems. Look into [email or web address][email or web address] or other type of food journal to assist you in this process.  Please schedule followup in 3-4 months, call sooner if problems.

## 2014-04-06 NOTE — Assessment & Plan Note (Signed)
Working with endo intermittently on same - reports improving control There have been some probable medication, dietary and lifestyle compliance issues here. I have discussed with her the great importance of following the treatment plan exactly as directed in order to achieve a good medical outcome. Check BMET, A1C today  Peripheral neuropathy symptoms - started Neurontin for same 07/2013.   Lab Results  Component Value Date   HGBA1C 9.0* 01/24/2014

## 2014-04-06 NOTE — Progress Notes (Signed)
Pre visit review using our clinic review tool, if applicable. No additional management support is needed unless otherwise documented below in the visit note. 

## 2014-04-06 NOTE — Assessment & Plan Note (Signed)
The current medical regimen is reasonably effective;  encouraged ARB compliance given DM.   BP Readings from Last 3 Encounters:  04/06/14 142/68  01/24/14 127/56  12/10/13 122/70

## 2014-04-06 NOTE — Progress Notes (Signed)
Subjective:    Patient ID: Beverly Vargas, female    DOB: 09-06-1927, 80 y.o.   MRN: [id number]  HPI  Patient is here for follow up  Reviewed chronic medical issues and interval medical events  Past Medical History  Diagnosis Date  . VITAMIN B12 DEFICIENCY   . ARTHRITIS, GENERALIZED   . SCHATZKI'S RING, HX OF 12/2003    dilation to 43mm on egd  . ANXIETY   . HYPERTENSION   . HYPERLIPIDEMIA   . GERD   . DIABETES MELLITUS, TYPE II   . Nontoxic multinodular goiter   . ANEMIA-NOS     iron def, B12 defic - egd 4/07: gastritis, clo neg; colo 2/05 neg    Review of Systems  Respiratory: Positive for shortness of breath ("when i'm nervous"). Negative for cough.   Cardiovascular: Negative for chest pain and leg swelling.  Psychiatric/Behavioral: Positive for decreased concentration. Negative for suicidal ideas, confusion, sleep disturbance and self-injury. The patient is nervous/anxious.        Objective:   Physical Exam  BP 142/68  Pulse 61  Temp(Src) 98 F (36.7 C) (Oral)  Wt 196 lb 6.4 oz (89.086 kg)  SpO2 95% Wt Readings from Last 3 Encounters:  04/06/14 196 lb 6.4 oz (89.086 kg)  12/10/13 200 lb (90.719 kg)  09/01/13 204 lb (92.534 kg)   Constitutional: She is obese, but appears well-developed and well-nourished. No distress.  Neck: Normal range of motion. Neck supple. No JVD present. No thyromegaly present.  Cardiovascular: Normal rate, regular rhythm and normal heart sounds.  No murmur heard. No BLE edema. Pulmonary/Chest: Effort normal and breath sounds normal. No respiratory distress. She has no wheezes.  Psychiatric: She has a normal mood and affect. Her behavior is normal. Judgment and thought content normal.   Lab Results  Component Value Date   WBC 10.6* 12/10/2013   HGB 11.1* 12/10/2013   HCT 34.1* 12/10/2013   PLT 285.0 12/10/2013   GLUCOSE 193* 01/24/2014   CHOL 214* 01/24/2014   TRIG 208* 01/24/2014   HDL 67 01/24/2014   LDLDIRECT 150.2 06/19/2012   LDLCALC 105* 01/24/2014   ALT 15 01/24/2014   AST 16 01/24/2014   NA 141 01/24/2014   K 4.3 01/24/2014   CL 102 01/24/2014   CREATININE 1.21* 01/24/2014   BUN 27* 01/24/2014   CO2 23 01/24/2014   TSH 1.725 12/22/2012   INR 0.90 12/22/2012   HGBA1C 9.0* 01/24/2014   MICROALBUR 2.2* 03/30/2013    Mm Screening Breast Tomo Bilateral  03/01/2014   CLINICAL DATA:  Screening.  EXAM: DIGITAL SCREENING BILATERAL MAMMOGRAM WITH 3D TOMO WITH CAD  COMPARISON:  Previous exam(s).  ACR Breast Density Category c: The breast tissue is heterogeneously dense, which may obscure small masses.  FINDINGS: There are no findings suspicious for malignancy. Images were processed with CAD.  IMPRESSION: No mammographic evidence of malignancy. A result letter of this screening mammogram will be mailed directly to the patient.  RECOMMENDATION: Screening mammogram in one year. (Code:SM-B-01Y)  BI-RADS CATEGORY  1: Negative.   Electronically Signed   By: Hulan Saas M.D.   On: 03/01/2014 13:12       Assessment & Plan:   Problem List Items Addressed This Visit   Depression     Several low grade symptoms - Check screening labs Start low dose SSRI - we reviewed potential risk/benefit and possible side effects - pt understands and agrees to same  Verified no SI/HI follow up 6-12 wks,  sooner if problems    Relevant Medications      PARoxetine (PAXIL) tablet   Other Relevant Orders      CBC with Differential      TSH   DIABETES MELLITUS, TYPE II - Primary      Working with endo intermittently on same - reports improving control There have been some probable medication, dietary and lifestyle compliance issues here. I have discussed with her the great importance of following the treatment plan exactly as directed in order to achieve a good medical outcome. Check BMET, A1C today  Peripheral neuropathy symptoms - started Neurontin for same 07/2013.   Lab Results  Component Value Date   HGBA1C 9.0* 01/24/2014      Relevant  Orders      Hemoglobin A1c      Microalbumin / creatinine urine ratio   HYPERTENSION      The current medical regimen is reasonably effective;  encouraged ARB compliance given DM.   BP Readings from Last 3 Encounters:  04/06/14 142/68  01/24/14 127/56  12/10/13 122/70      VITAMIN B12 DEFICIENCY   Relevant Medications      cyanocobalamin ((VITAMIN B-12)) injection 1,000 mcg (Completed)

## 2014-04-07 LAB — MICROALBUMIN / CREATININE URINE RATIO
Creatinine,U: 81.7 mg/dL
Microalb Creat Ratio: 2 mg/g (ref 0.0–30.0)
Microalb, Ur: 1.6 mg/dL (ref 0.0–1.9)

## 2014-04-14 ENCOUNTER — Other Ambulatory Visit: Payer: Self-pay | Admitting: Internal Medicine

## 2014-05-05 ENCOUNTER — Ambulatory Visit (INDEPENDENT_AMBULATORY_CARE_PROVIDER_SITE_OTHER): Payer: Medicare Other

## 2014-05-05 ENCOUNTER — Other Ambulatory Visit: Payer: Self-pay | Admitting: Internal Medicine

## 2014-05-05 DIAGNOSIS — E538 Deficiency of other specified B group vitamins: Secondary | ICD-10-CM

## 2014-05-05 MED ORDER — CYANOCOBALAMIN 1000 MCG/ML IJ SOLN
1000.0000 ug | Freq: Once | INTRAMUSCULAR | Status: AC
  Administered 2014-05-05: 1000 ug via INTRAMUSCULAR

## 2014-05-18 ENCOUNTER — Ambulatory Visit (INDEPENDENT_AMBULATORY_CARE_PROVIDER_SITE_OTHER): Payer: Medicare Other | Admitting: Internal Medicine

## 2014-05-18 ENCOUNTER — Encounter: Payer: Self-pay | Admitting: Internal Medicine

## 2014-05-18 VITALS — BP 136/78 | HR 86 | Temp 98.0°F | Wt 184.2 lb

## 2014-05-18 DIAGNOSIS — R634 Abnormal weight loss: Secondary | ICD-10-CM

## 2014-05-18 DIAGNOSIS — E785 Hyperlipidemia, unspecified: Secondary | ICD-10-CM

## 2014-05-18 DIAGNOSIS — R29898 Other symptoms and signs involving the musculoskeletal system: Secondary | ICD-10-CM

## 2014-05-18 DIAGNOSIS — D539 Nutritional anemia, unspecified: Secondary | ICD-10-CM

## 2014-05-18 DIAGNOSIS — R630 Anorexia: Secondary | ICD-10-CM

## 2014-05-18 NOTE — Progress Notes (Signed)
Pre visit review using our clinic review tool, if applicable. No additional management support is needed unless otherwise documented below in the visit note. 

## 2014-05-18 NOTE — Progress Notes (Signed)
   Subjective:    Patient ID: Beverly Vargas, female    DOB: 11/13/1926, 80 y.o.   MRN: [id number]  HPI   She has multiple concerns including loss of appetite, weight loss, and right arm weakness  Her major concern is the weakness in the right upper extremity which is almost daily. She states that she loses her grip and will spill glasses/cups of fluid. She does have some numbness in her hands but the weakness is not associated with any numbness and tingling specifically.  She also describes stiffness in the left knee area. She also describes soreness in her shoulders.  The flow sheet of vital signs was reviewed. From 5/27 up until 05/18/14 her weight has dropped from 196 lbs. 6 oz. down to 184 pounds and 3 ounces, loss of approximately 12 pounds. In July of 2014 her weight was 192 pounds and 14 ounces which represented 9 pounds change over the past 12 months.   The anorexia is associated with some nausea. She describes frequent stools up to 2-3 per day. She had noted black stool but this has resolved  She describes exertional dyspnea but saw her cardiologist, Dr. Sharyn LullHarwani.  Her chart was reviewed. 04/06/14 hemoglobin 11.2/hematocrit 33.8. Neutrophil count was slightly reduced.  At that time her A1c was 7.4%. TSH was therapeutic at 1.03. Her last lipids were 3/6: Total cholesterol 214; triglycerides 208; LDL 15; HDL 67.     Review of Systems   She denies chest pain or palpitations with the dyspnea.  She has no associated significant headaches.  There is no neurologic prodrome prior to the weakness of the upper extremity.     Objective:   Physical Exam  Significant or distinguishing  findings on physical exam are documented first.  Below that are other systems examined & findings.   She appears well-nourished; central weight excess is present She does have ptosis bilaterally She has upper dental plate An S4 is noted with grade 1/2-1 systolic murmur Fusiform changes are noted of  the knees with some crepitus. There is marked decrease in elevation of the shoulders bilaterally. Crepitus is present. Strength by confrontational testing appears normal in upper extremities. Pedal pulses are equal but decreased.   Eyes: No conjunctival inflammation or scleral icterus is present.  Oral exam:  lips and gums are healthy appearing.There is no oropharyngeal erythema or exudate noted.   Heart:  Normal rate and regular rhythm. S1 and S2 normal     Lungs:Chest clear to auscultation; no wheezes, rhonchi,rales ,or rubs present.No increased work of breathing.   Abdomen: bowel sounds normal, soft and non-tender without masses, organomegaly or hernias noted.  No guarding or rebound .   Musculoskeletal: Able to lie flat and sit up without help. Negative straight leg raising bilaterally. Gait broad  Skin:Warm & dry.  Intact without suspicious lesions or rashes ; no jaundice or tenting  Lymphatic: No lymphadenopathy is noted about the head, neck, axilla             Assessment & Plan:  #1 anorexia  #2 documented weight loss  #3 weakness right upper extremity subjectively  #4 clinical shoulder impingement syndrome bilaterally  #5 statin therapy  See orders and recommendations

## 2014-05-18 NOTE — Patient Instructions (Signed)
Your next office appointment will be determined based upon review of your pending labs . Those instructions will be transmitted to you  by mail Followup as needed for your acute issue. Please report any significant change in your symptoms.   

## 2014-06-02 ENCOUNTER — Ambulatory Visit: Payer: Medicare Other

## 2014-06-08 ENCOUNTER — Ambulatory Visit (INDEPENDENT_AMBULATORY_CARE_PROVIDER_SITE_OTHER): Payer: Medicare Other

## 2014-06-08 DIAGNOSIS — E538 Deficiency of other specified B group vitamins: Secondary | ICD-10-CM

## 2014-06-08 MED ORDER — CYANOCOBALAMIN 1000 MCG/ML IJ SOLN
1000.0000 ug | Freq: Once | INTRAMUSCULAR | Status: AC
  Administered 2014-06-08: 1000 ug via INTRAMUSCULAR

## 2014-06-18 ENCOUNTER — Telehealth: Payer: Self-pay | Admitting: Family Medicine

## 2014-06-19 NOTE — Telephone Encounter (Signed)
Noted - and thanks for covering call

## 2014-06-24 IMAGING — CR DG FOOT 2V*R*
2 series · 2 of 2 positions shown · non-contrast
Comparison: None.

CLINICAL DATA: Right foot pain for 1 month. No known injury.

EXAM:
RIGHT FOOT - 2 VIEW

[view not recorded (1 of 2)]
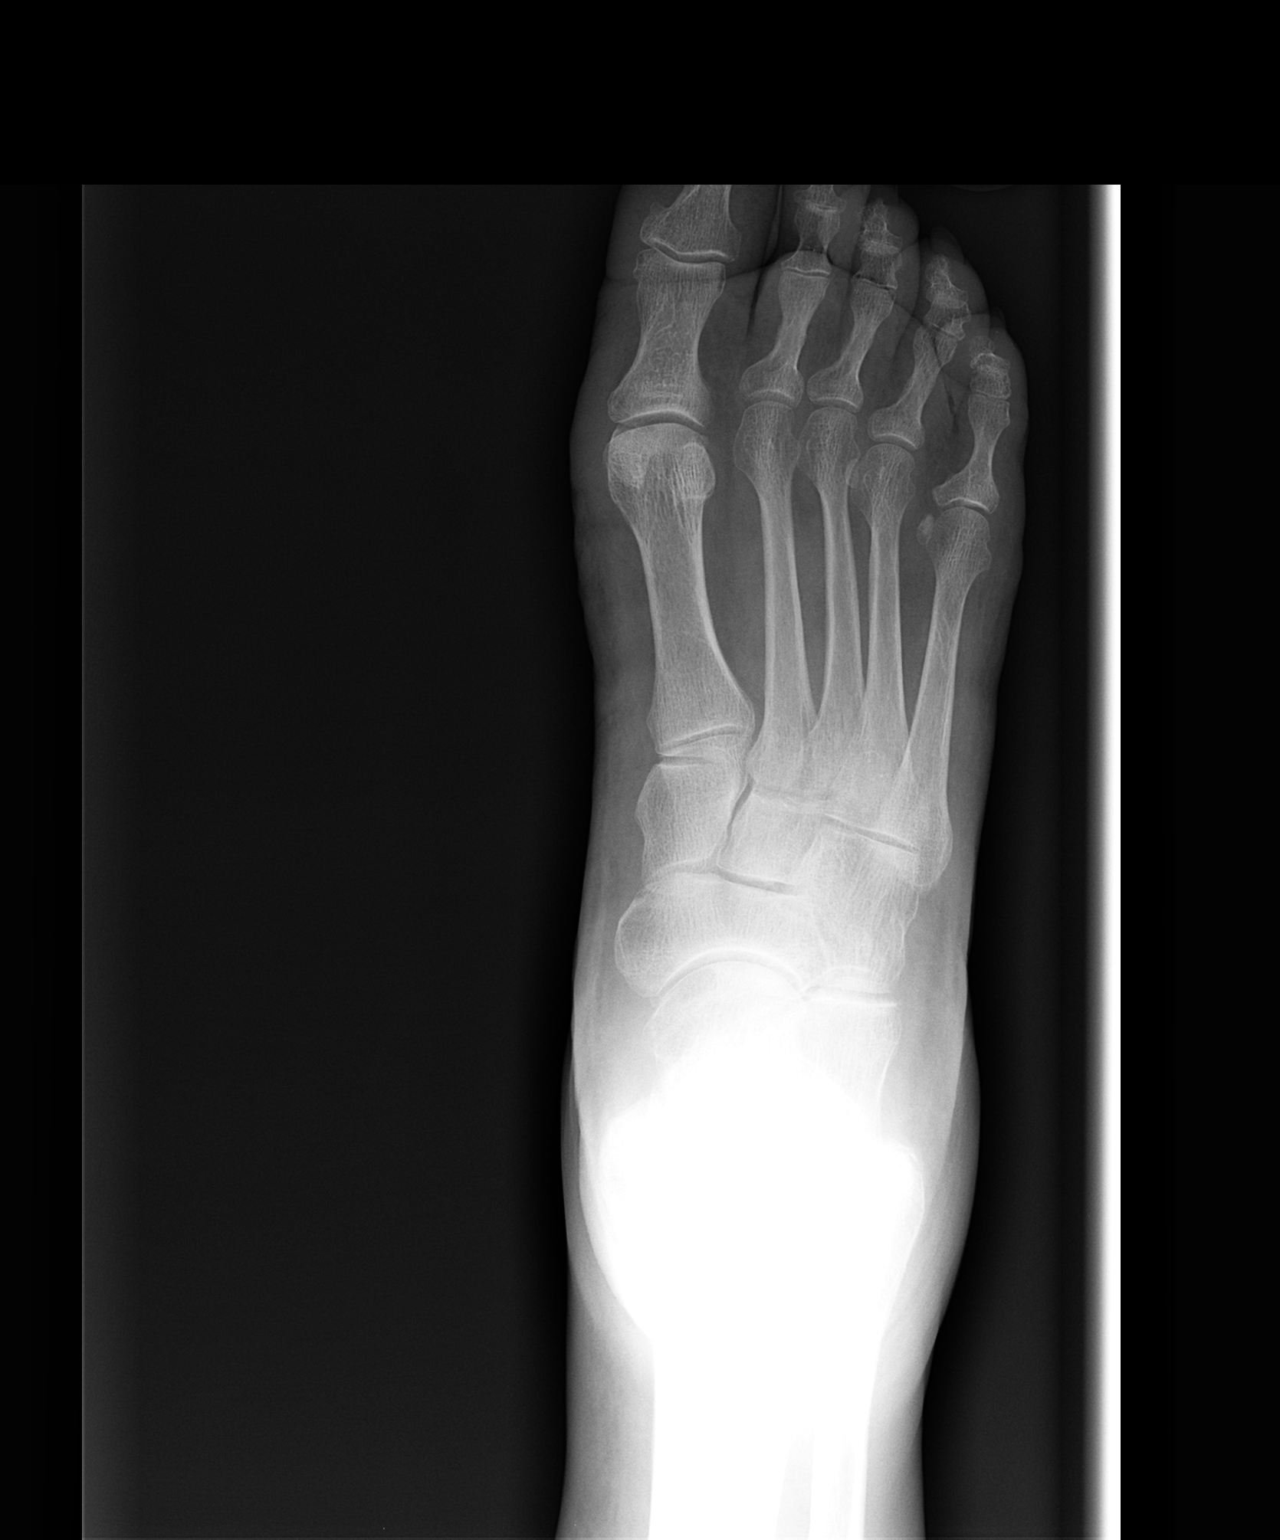

[view not recorded (2 of 2)]
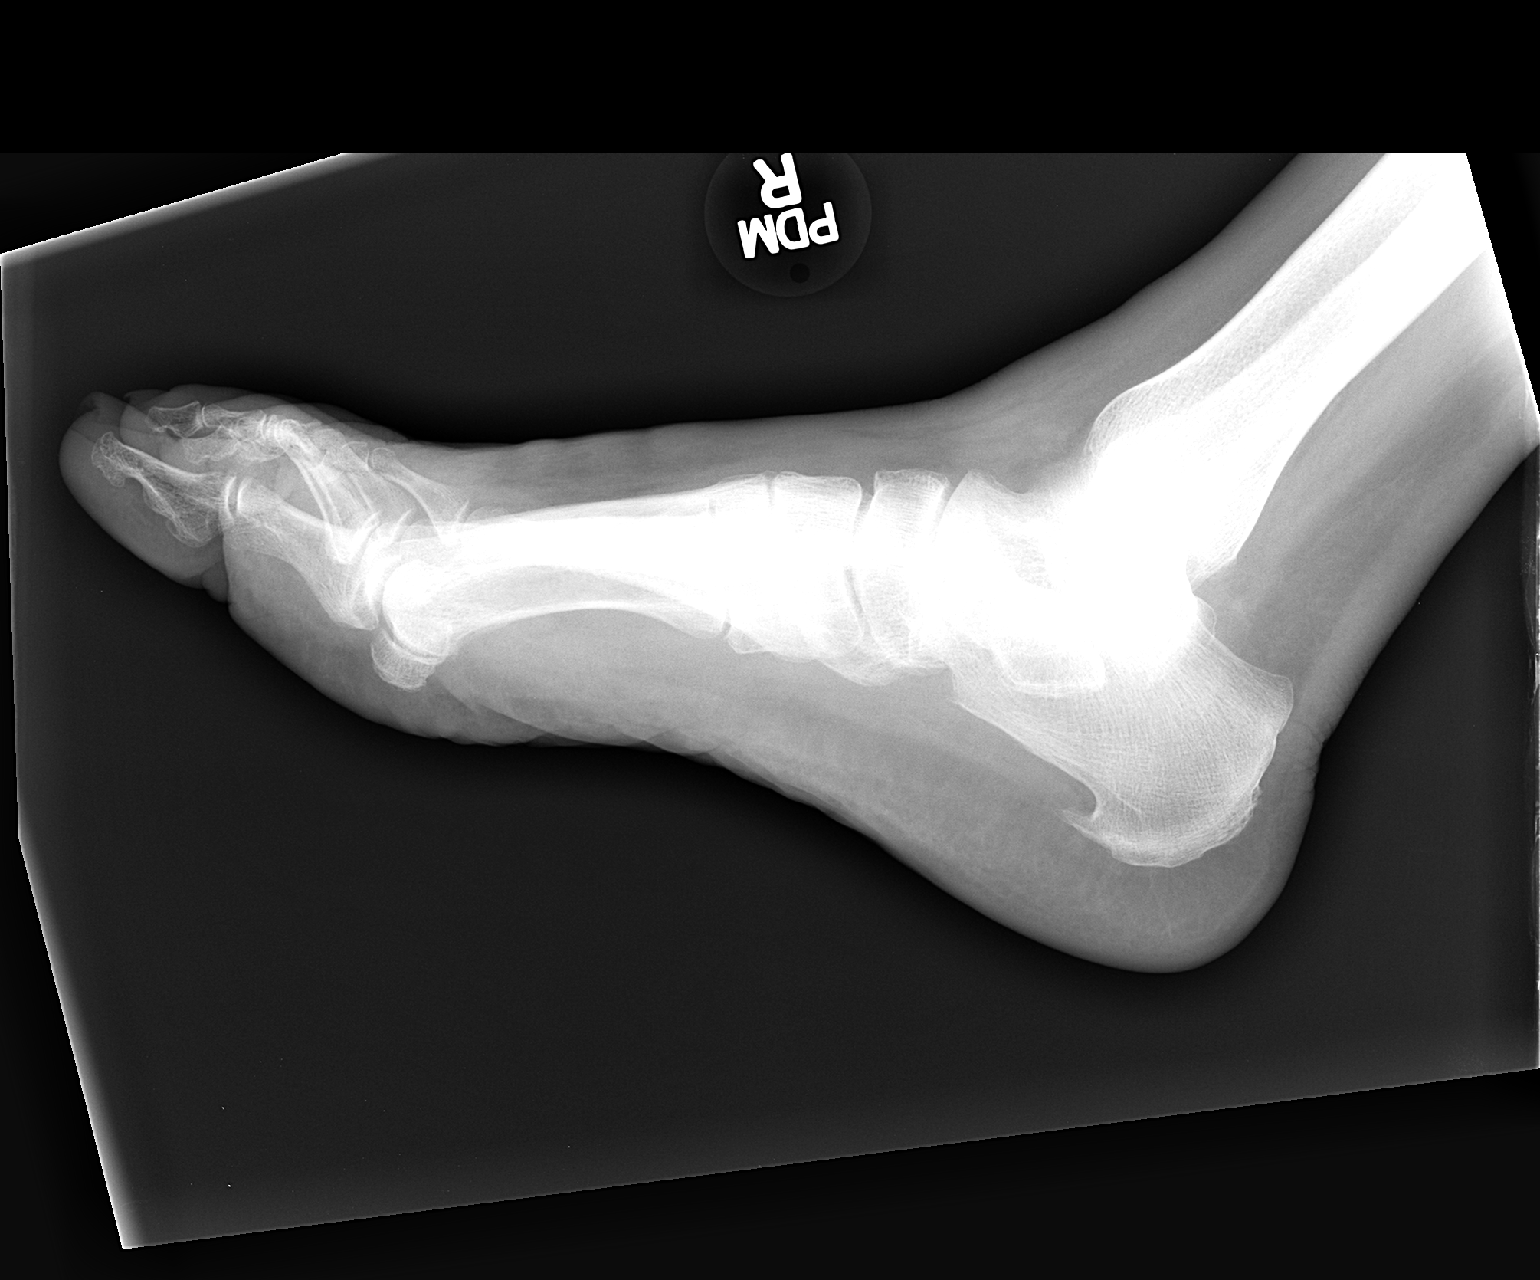

[2 of 2 positions shown; findings below may reference images not displayed]

FINDINGS: No acute or focal bony or joint abnormality is identified. No
notable degenerative change is seen. Small plantar calcaneal spur is
noted. Soft tissue structures are unremarkable.
IMPRESSION: No acute or focal abnormality. Small calcaneal spur.

## 2014-07-06 ENCOUNTER — Ambulatory Visit: Payer: Medicare Other | Admitting: Internal Medicine

## 2014-07-07 ENCOUNTER — Ambulatory Visit: Payer: Medicare Other

## 2014-07-12 NOTE — Telephone Encounter (Signed)
FYI-- On-call note: I received call from EMS to notify me that pt is deceased. Family found her in her room unresponsive/no breathing. EMS placed monitor on her upon arrival and she was in asystole. No resuscitation was attempted. I accepted responsibility for death certificate.

## 2014-07-12 DEATH — deceased
